# Patient Record
Sex: Female | Born: 1990 | Race: Black or African American | Hispanic: No | Marital: Single | Smoking: Former smoker
Health system: Southern US, Community
[De-identification: ages and names within clinical notes are randomized; demographics above are authoritative.]

## PROBLEM LIST (undated history)

## (undated) ENCOUNTER — Inpatient Hospital Stay (HOSPITAL_COMMUNITY): Payer: Self-pay

## (undated) DIAGNOSIS — I10 Essential (primary) hypertension: Secondary | ICD-10-CM

## (undated) DIAGNOSIS — B999 Unspecified infectious disease: Secondary | ICD-10-CM

## (undated) DIAGNOSIS — R011 Cardiac murmur, unspecified: Secondary | ICD-10-CM

## (undated) HISTORY — PX: BREAST LUMPECTOMY: SHX2

## (undated) HISTORY — DX: Essential (primary) hypertension: I10

## (undated) HISTORY — PX: INDUCED ABORTION: SHX677

---
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
## 2008-10-22 ENCOUNTER — Emergency Department (HOSPITAL_COMMUNITY): Admission: EM | Admit: 2008-10-22 | Discharge: 2008-10-22 | Payer: Self-pay | Admitting: Family Medicine

## 2009-02-01 ENCOUNTER — Emergency Department (HOSPITAL_COMMUNITY): Admission: EM | Admit: 2009-02-01 | Discharge: 2009-02-01 | Payer: Self-pay | Admitting: Family Medicine

## 2009-06-09 ENCOUNTER — Emergency Department (HOSPITAL_COMMUNITY): Admission: EM | Admit: 2009-06-09 | Discharge: 2009-06-09 | Payer: Self-pay | Admitting: Family Medicine

## 2009-10-24 ENCOUNTER — Emergency Department (HOSPITAL_COMMUNITY): Admission: EM | Admit: 2009-10-24 | Discharge: 2009-10-24 | Payer: Self-pay | Admitting: Family Medicine

## 2010-10-13 NOTE — Op Note (Signed)
NAME:  Kristin Medina, Kristin Medina              ACCOUNT NO.:  [id number]   MEDICAL RECORD NO.:  [id number]          PATIENT TYPE:  AMB   LOCATION:  DSC                          FACILITY:  [facility]   PHYSICIAN:  Leonie Man, M.D.   DATE OF BIRTH:  [birth year]   DATE OF PROCEDURE:  08/30/2004  DATE OF DISCHARGE:                                 OPERATIVE REPORT   PREOPERATIVE DIAGNOSIS:  Giant fibroadenoma left breast.   POSTOPERATIVE DIAGNOSIS:  Giant fibroadenoma left breast, pathology pending.   PROCEDURE:  Excision of left breast mass surgeon.   BALLEN:  Leonie Man, M.D.   ASSISTANT:  Nurse.   ANESTHESIA:  General.   SPECIMENS FORWARDED TO PATHOLOGY:  Mass, left breast, measuring  approximately 4 cm in greatest diameter.   ESTIMATED BLOOD LOSS:  Minimal.   COMPLICATIONS:  None.   The patient returned to the PACU in excellent condition.   NOTE:  The patient is a 20 year old female student presenting with an  enlarging left-sided breast mass located medially and superiorly in the 10  to 12 o'clock position of the left breast.  This has not been causing her  any pain.  On imaging, it appears to be a solid mass.  She comes to the  operating room now after risks and potential benefits have both been  discussed with her and her family.  They gave consent to surgery.   PROCEDURE:  Following the induction of satisfactory general anesthesia, the  patient is positioned supinely.  The left breast is prepped and draped to be  included in the sterile operative field.  A circumareolar incision carried  around from approximately the 6 o'clock to the 12 o'clock position of the  left nipple, raising the nipple as a flap and carrying the dissection  laterally, medially and superiorly.  The mass was dissected free from the  surrounding breast tissues, removed in its entirety and forwarded for  pathologic evaluation.  Hemostasis was obtained with electrocautery.  Sponge  and instrument counts  verified.  Breast tissues reapproximated with  interrupted 2-0 Vicryl  sutures, subcutaneous tissues closed with 3-0 Vicryl sutures and the skin  closed with a 5-0 Monocryl running subdermal stitch.  This was then  reinforced with Steri-Strips, sterile dressings applied, anesthetic  reversed.  The patient removed from the operating room to the recovery room  in stable condition.  She tolerated the procedure well.      PB/MEDQ  D:  08/30/2004  T:  08/30/2004  Job:  [id number]   cc:   Ermalinda Barrios, M.D.  [address] [place]  [place] [address]  Fax: [phone]

## 2011-03-08 LAB — GC/CHLAMYDIA PROBE AMP, GENITAL: Chlamydia, DNA Probe: NEGATIVE

## 2011-03-08 LAB — WET PREP, GENITAL
Clue Cells Wet Prep HPF POC: NONE SEEN
Trich, Wet Prep: NONE SEEN

## 2011-03-08 LAB — POCT PREGNANCY, URINE: Operator id: 235561

## 2011-08-21 ENCOUNTER — Encounter (HOSPITAL_COMMUNITY): Payer: Self-pay | Admitting: *Deleted

## 2011-08-21 ENCOUNTER — Emergency Department (INDEPENDENT_AMBULATORY_CARE_PROVIDER_SITE_OTHER)
Admission: EM | Admit: 2011-08-21 | Discharge: 2011-08-21 | Disposition: A | Discharge status: Home / Self Care | Payer: BC Managed Care – PPO | Source: Home / Self Care | Attending: Family Medicine | Admitting: Family Medicine

## 2011-08-21 DIAGNOSIS — J039 Acute tonsillitis, unspecified: Secondary | ICD-10-CM

## 2011-08-21 LAB — POCT RAPID STREP A: Streptococcus, Group A Screen (Direct): NEGATIVE

## 2011-08-21 MED ORDER — PENICILLIN V POTASSIUM 500 MG PO TABS: 500.0000 mg | ORAL_TABLET | Freq: Three times a day (TID) | ORAL | Status: AC

## 2011-08-21 MED ORDER — LIDOCAINE HCL (PF) 1 % IJ SOLN
INTRAMUSCULAR | Status: AC
  Filled 2011-08-21: qty 5

## 2011-08-21 MED ORDER — CEFTRIAXONE SODIUM 1 G IJ SOLR
1.0000 g | Freq: Once | INTRAMUSCULAR | Status: AC
  Administered 2011-08-21: 1 g via INTRAMUSCULAR

## 2011-08-21 MED ORDER — IBUPROFEN 600 MG PO TABS: 600.0000 mg | ORAL_TABLET | Freq: Three times a day (TID) | ORAL | Status: AC | PRN

## 2011-08-21 MED ORDER — HYDROCODONE-ACETAMINOPHEN 7.5-500 MG/15ML PO SOLN: 15.0000 mL | Freq: Three times a day (TID) | ORAL | Status: AC | PRN

## 2011-08-21 MED ORDER — CEFTRIAXONE SODIUM 1 G IJ SOLR
INTRAMUSCULAR | Status: AC
  Filled 2011-08-21: qty 10

## 2011-08-21 MED ORDER — PREDNISONE 20 MG PO TABS: ORAL_TABLET | ORAL | Status: AC

## 2011-08-21 NOTE — Discharge Instructions (Signed)
Is very important top keep well hydrated. Take the prescribed medications as instructed. Take ibuprofen scheduled every 8 hours for the next 24-48 hours take with food and plenty of liquids as it can upset your stomach. Be aware that hydrocodone can make you drowsy and she should not drive after taking it. Use nasal saline spray at least 3 times a day. (simply saline is over the counter) Return if difficulty breathing or not keeping fluids down.

## 2011-08-21 NOTE — ED Notes (Signed)
Onset 2 days ago sore throat and red, raised rash on arms and neck.  Denies fever

## 2011-08-21 NOTE — ED Provider Notes (Signed)
History     CSN: [id number]  Arrival date & time 08/21/11  1616   First MD Initiated Contact with Patient 08/21/11 1617      Chief Complaint  Patient presents with  . Sore Throat    (Consider location/radiation/quality/duration/timing/severity/associated sxs/prior treatment) HPI Comments: 21 y/o female with no significant PMH here c/o sore throat and pain with swallowing for 2 days. able to drink fluids well and solids with discomfort. Taking ibuprofen last dose about 3 hours ago. Has felt headache and chills but denies fever. Also started with rash on arms and neck 2 days ago, pruriginous. No abdominal pain, no fatigue, no nausea or vomiting, no cough or congestion. No chest pain or difficulty breathing.    History reviewed. No pertinent past medical history.  Past Surgical History  Procedure Date  . Breast lumpectomy     Family History  Problem Relation Age of Onset  . Diabetes Father     History  Substance Use Topics  . Smoking status: Never Smoker   . Smokeless tobacco: Not on file  . Alcohol Use: No    OB History    Grav Para Term Preterm Abortions TAB SAB Ect Mult Living                  Review of Systems  Constitutional: Positive for chills and appetite change. Negative for fever, diaphoresis and fatigue.  HENT: Positive for sore throat and trouble swallowing. Negative for ear pain, congestion, rhinorrhea, drooling, neck pain, neck stiffness and voice change.   Respiratory: Negative for cough and shortness of breath.   Gastrointestinal: Negative for nausea, vomiting and abdominal pain.  Musculoskeletal: Negative for arthralgias.  Skin: Positive for rash.  Neurological: Positive for headaches.    Allergies  Review of patient's allergies indicates no known allergies.  Home Medications   Current Outpatient Rx  Name Route Sig Dispense Refill  . HYDROCODONE-ACETAMINOPHEN 7.5-500 MG/15ML PO SOLN Oral Take 15 mLs by mouth every 8 (eight) hours as needed for  pain. 120 mL 0  . IBUPROFEN 600 MG PO TABS Oral Take 1 tablet (600 mg total) by mouth every 8 (eight) hours as needed for pain or fever. 20 tablet 0  . PENICILLIN V POTASSIUM 500 MG PO TABS Oral Take 1 tablet (500 mg total) by mouth 3 (three) times daily. 30 tablet 0  . PREDNISONE 20 MG PO TABS  2 tabs po daily for 3 days 6 tablet no    BP 130/75  Pulse 74  Temp(Src) 97.1 F (36.2 C) (Oral)  Resp 14  SpO2 100%  LMP 08/07/2011  Physical Exam  Nursing note and vitals reviewed. Constitutional: She is oriented to person, place, and time. She appears well-developed and well-nourished. No distress.  HENT:  Head: Normocephalic and atraumatic.       Nose normal. Significant pharyngeal and tonsillar erythema. Both tonsils moderately enlarged and exudative. No uvula deviation. no peritonsillar edema or fluctuations. symetrical elevation of soft palate. No trismus. No drooling. Tongue normal. TM's normal.  Eyes: Conjunctivae and EOM are normal. Pupils are equal, round, and reactive to light. Right eye exhibits no discharge. Left eye exhibits no discharge. No scleral icterus.  Neck: Neck supple. No thyromegaly present.  Cardiovascular: Normal rate, regular rhythm and normal heart sounds.   No murmur heard. Pulmonary/Chest: Effort normal and breath sounds normal. No respiratory distress. She has no wheezes. She has no rales. She exhibits no tenderness.  Abdominal: Soft. There is no tenderness.  No HSM  Lymphadenopathy:    She has cervical adenopathy.  Neurological: She is alert and oriented to person, place, and time.  Skin: Rash noted.       Fine sand paper like rash in arms and upper torso. Pruriginous as per patient report. No involvement of palms or soles no mucosal involvement either.     ED Course  Procedures (including critical care time)   Labs Reviewed  POCT RAPID STREP A ([facility] CARE ONLY)  POCT INFECTIOUS MONO SCREEN  STREP A DNA PROBE  LAB REPORT - SCANNED   No  results found.   1. Tonsillitis with exudate       MDM  Exudative tonsils, rapid strep and mono screen negative. Administered rocephin 1g IM here. Prescribed penicillin for 10 days, prednisone for 3 days to decrease swelling,  Hydrocodone and ibuprofen for pain or fever. DNA strep pending. Asked to return in 24-48 hours or earlier if worsening symptoms despite following treatment.         Sharin Grave, MD 08/23/11 1310

## 2011-11-19 ENCOUNTER — Emergency Department (INDEPENDENT_AMBULATORY_CARE_PROVIDER_SITE_OTHER): Payer: BC Managed Care – PPO

## 2011-11-19 ENCOUNTER — Encounter (HOSPITAL_COMMUNITY): Payer: Self-pay | Admitting: *Deleted

## 2011-11-19 ENCOUNTER — Emergency Department (HOSPITAL_COMMUNITY)
Admission: EM | Admit: 2011-11-19 | Discharge: 2011-11-19 | Disposition: A | Discharge status: Home / Self Care | Payer: BC Managed Care – PPO | Source: Home / Self Care

## 2011-11-19 DIAGNOSIS — S93409A Sprain of unspecified ligament of unspecified ankle, initial encounter: Secondary | ICD-10-CM

## 2011-11-19 MED ORDER — TRAMADOL HCL 50 MG PO TABS: 50.0000 mg | ORAL_TABLET | Freq: Four times a day (QID) | ORAL | Status: AC | PRN

## 2011-11-19 NOTE — ED Provider Notes (Signed)
History     CSN: [id number]  Arrival date & time 11/19/11  1202   First MD Initiated Contact with Patient 11/19/11 1203      No chief complaint on file.   (Consider location/radiation/quality/duration/timing/severity/associated sxs/prior treatment) HPI Patient is a 21 year old female who presents with main concern of left ankle pain and swelling that she noted this morning. She reports twisting her ankle 2 days ago as she was walking and stepped on the foot uncomfortably. She denies similar episodes in the past. She describes pain as dull, intermittent in nature, 7/10 in severity when present, nonradiating, no associated symptoms, no specific aggravating or alleviating symptoms. She is able to ambulate but has difficulty bearing weight on the left foot. Patient denies fevers and chills, no swelling anywhere else in the body, no focal neurologic weakness.  No past medical history on file.  Past Surgical History  Procedure Date  . Breast lumpectomy     Family History  Problem Relation Age of Onset  . Diabetes Father     History  Substance Use Topics  . Smoking status: Never Smoker   . Smokeless tobacco: Not on file  . Alcohol Use: No    OB History    Grav Para Term Preterm Abortions TAB SAB Ect Mult Living                  Review of Systems  Review of Systems  Constitutional: Negative for fever, chills, diaphoresis, activity change, appetite change and fatigue.  HENT: Negative for ear pain, nosebleeds, congestion, facial swelling, rhinorrhea, neck pain, neck stiffness and ear discharge.   Eyes: Negative for pain, discharge, redness, itching and visual disturbance.  Respiratory: Negative for cough, choking, chest tightness, shortness of breath, wheezing and stridor.   Cardiovascular: Negative for chest pain, palpitations and leg swelling.  Gastrointestinal: Negative for abdominal distention.  Genitourinary: Negative for dysuria, urgency, frequency, hematuria, flank  pain, decreased urine volume, difficulty urinating and dyspareunia.  Musculoskeletal: Negative for back pain, arthralgias. Neurological: Negative for dizziness, tremors, seizures, syncope, facial asymmetry, speech difficulty, weakness, light-headedness, numbness and headaches.  Hematological: Negative for adenopathy. Does not bruise/bleed easily.  Psychiatric/Behavioral: Negative for hallucinations, behavioral problems, confusion, dysphoric mood, decreased concentration and agitation.    Allergies  Review of patient's allergies indicates no known allergies.  Home Medications  No current outpatient prescriptions on file.  BP 119/80  Pulse 64  Temp 98.3 F (36.8 C) (Oral)  Resp 14  SpO2 99%  Physical Exam  Physical Exam  Constitutional: She appears well-developed and well-nourished. No distress.  Musculoskeletal: Normal range of motion. She exhibits no edema and no tenderness, except left ankle area.  left ankle area has swelling, and tenderness to palpation  Neurological: She is alert. She has normal reflexes. She displays normal reflexes. No cranial nerve deficit. She exhibits normal muscle tone. Coordination normal.  Skin: Skin is warm and dry. No rash noted. She is not diaphoretic. No erythema. No pallor.    ED Course  Procedures (including critical care time)  Labs Reviewed - No data to display  X-ray, complete of the left ankle - No acute fractures or subluxation  Ankle sprain - Please note that x-ray did not show any acute findings, fractures or subluxation - I have advised nonweightbearing for 1 - 2 days until swelling and pain improves - I have prescribed tramadol for symptomatic pain management as needed - Patient was advised if symptoms get worse she needs to see primary care physician -  Will provide ankle brace for stabilization     MDM  Ankle sprain        Dorothea Ogle, MD 11/19/11 1351

## 2011-11-19 NOTE — Discharge Instructions (Signed)
Ankle Sprain An ankle sprain is an injury to the strong, fibrous tissues (ligaments) that hold the bones of your ankle joint together.  CAUSES Ankle sprain usually is caused by a fall or by twisting your ankle. People who participate in sports are more prone to these types of injuries.  SYMPTOMS  Symptoms of ankle sprain include:  Pain in your ankle. The pain may be present at rest or only when you are trying to stand or walk.   Swelling.   Bruising. Bruising may develop immediately or within 1 to 2 days after your injury.   Difficulty standing or walking.  DIAGNOSIS  Your caregiver will ask you details about your injury and perform a physical exam of your ankle to determine if you have an ankle sprain. During the physical exam, your caregiver will press and squeeze specific areas of your foot and ankle. Your caregiver will try to move your ankle in certain ways. An X-ray exam may be done to be sure a bone was not broken or a ligament did not separate from one of the bones in your ankle (avulsion).  TREATMENT  Certain types of braces can help stabilize your ankle. Your caregiver can make a recommendation for this. Your caregiver may recommend the use of medication for pain. If your sprain is severe, your caregiver may refer you to a surgeon who helps to restore function to parts of your skeletal system (orthopedist) or a physical therapist. HOME CARE INSTRUCTIONS  Apply ice to your injury for 1 to 2 days or as directed by your caregiver. Applying ice helps to reduce inflammation and pain.  Put ice in a plastic bag.   Place a towel between your skin and the bag.   Leave the ice on for 15 to 20 minutes at a time, every 2 hours while you are awake.   Take over-the-counter or prescription medicines for pain, discomfort, or fever only as directed by your caregiver.   Keep your injured leg elevated, when possible, to lessen swelling.   If your caregiver recommends crutches, use them as  instructed. Gradually, put weight on the affected ankle. Continue to use crutches or a cane until you can walk without feeling pain in your ankle.   If you have a plaster splint, wear the splint as directed by your caregiver. Do not rest it on anything harder than a pillow the first 24 hours. Do not put weight on it. Do not get it wet. You may take it off to take a shower or bath.   You may have been given an elastic bandage to wear around your ankle to provide support. If the elastic bandage is too tight (you have numbness or tingling in your foot or your foot becomes cold and blue), adjust the bandage to make it comfortable.   If you have an air splint, you may blow more air into it or let air out to make it more comfortable. You may take your splint off at night and before taking a shower or bath.   Wiggle your toes in the splint several times per day if you are able.  SEEK MEDICAL CARE IF:   You have an increase in bruising, swelling, or pain.   Your toes feel cold.   Pain relief is not achieved with medication.  SEEK IMMEDIATE MEDICAL CARE IF: Your toes are numb or blue or you have severe pain. MAKE SURE YOU:   Understand these instructions.   Will watch your condition.     Will get help right away if you are not doing well or get worse.  Document Released: 05/14/2005 Document Revised: 05/03/2011 Document Reviewed: 12/17/2007 [employer] Patient Information 2012 [employer]. 

## 2011-11-19 NOTE — ED Notes (Signed)
Pt  Reports     Injured    L  Ankle     Playing  Basketball        5  Days  Ago    Pain on  Weight   Bearing         denys  Any  Other  Injury   No  Obvious  Deformity  Noted

## 2012-05-28 NOTE — L&D Delivery Note (Signed)
Patient was C/C/+4 and pushed for 5 minutes with epidural.   NSVD  female infant, Apgars 0,0, weight p.   The patient had no lacerations. Fundus was firm. EBL was expected. Placenta was delivered immediately intact. Vagina was clear.  Baby examined and other than strong odor of infection appeared normal and without external anomalies.   Baby did not have appearance of having been deceased for long and skin was intact.  Placenta was sent for culture for aerobe and anaerobe. Placenta and cord send for chromosomes- request made for FISH if unable to culture cells TORCH titers sent with Rhogam work up.  D/w pt above findings and tests done.  D/w pt will give number to service that will help with funeral arrangements.  Ritisha Deitrick A

## 2012-08-21 ENCOUNTER — Emergency Department (HOSPITAL_COMMUNITY)
Admission: EM | Admit: 2012-08-21 | Discharge: 2012-08-21 | Disposition: A | Discharge status: Home / Self Care | Payer: Federal, State, Local not specified - PPO | Attending: Emergency Medicine | Admitting: Emergency Medicine

## 2012-08-21 DIAGNOSIS — R1084 Generalized abdominal pain: Secondary | ICD-10-CM | POA: Insufficient documentation

## 2012-08-21 DIAGNOSIS — R197 Diarrhea, unspecified: Secondary | ICD-10-CM | POA: Insufficient documentation

## 2012-08-21 DIAGNOSIS — Z3202 Encounter for pregnancy test, result negative: Secondary | ICD-10-CM | POA: Insufficient documentation

## 2012-08-21 DIAGNOSIS — R112 Nausea with vomiting, unspecified: Secondary | ICD-10-CM

## 2012-08-21 LAB — COMPREHENSIVE METABOLIC PANEL
Albumin: 4.5 g/dL (ref 3.5–5.2)
BUN: 15 mg/dL (ref 6–23)
CO2: 25 mEq/L (ref 19–32)
Calcium: 9.6 mg/dL (ref 8.4–10.5)
Creatinine, Ser: 0.63 mg/dL (ref 0.50–1.10)
Glucose, Bld: 99 mg/dL (ref 70–99)
Potassium: 3.4 mEq/L — ABNORMAL LOW (ref 3.5–5.1)
Sodium: 141 mEq/L (ref 135–145)
Total Protein: 9.1 g/dL — ABNORMAL HIGH (ref 6.0–8.3)

## 2012-08-21 LAB — URINE MICROSCOPIC-ADD ON

## 2012-08-21 LAB — CBC WITH DIFFERENTIAL/PLATELET
Basophils Absolute: 0 10*3/uL (ref 0.0–0.1)
Basophils Relative: 0 % (ref 0–1)
HCT: 39.7 % (ref 36.0–46.0)
Lymphs Abs: 0.6 10*3/uL — ABNORMAL LOW (ref 0.7–4.0)
MCV: 82.4 fL (ref 78.0–100.0)
Monocytes Relative: 3 % (ref 3–12)
Neutro Abs: 11.7 10*3/uL — ABNORMAL HIGH (ref 1.7–7.7)
Neutrophils Relative %: 93 % — ABNORMAL HIGH (ref 43–77)
Platelets: 348 10*3/uL (ref 150–400)
RDW: 15.2 % (ref 11.5–15.5)
WBC: 12.7 10*3/uL — ABNORMAL HIGH (ref 4.0–10.5)

## 2012-08-21 LAB — URINALYSIS, ROUTINE W REFLEX MICROSCOPIC
Glucose, UA: NEGATIVE mg/dL
Nitrite: NEGATIVE
Protein, ur: 100 mg/dL — AB
Urobilinogen, UA: 0.2 mg/dL (ref 0.0–1.0)

## 2012-08-21 LAB — POCT PREGNANCY, URINE: Preg Test, Ur: NEGATIVE

## 2012-08-21 MED ORDER — ONDANSETRON HCL 4 MG/2ML IJ SOLN
4.0000 mg | Freq: Once | INTRAMUSCULAR | Status: AC
  Administered 2012-08-21: 4 mg via INTRAVENOUS
  Filled 2012-08-21: qty 2

## 2012-08-21 MED ORDER — SODIUM CHLORIDE 0.9 % IV BOLUS (SEPSIS)
1000.0000 mL | Freq: Once | INTRAVENOUS | Status: AC
  Administered 2012-08-21: 1000 mL via INTRAVENOUS

## 2012-08-21 MED ORDER — ONDANSETRON 4 MG PO TBDP
8.0000 mg | ORAL_TABLET | Freq: Once | ORAL | Status: AC
  Administered 2012-08-21: 8 mg via ORAL
  Filled 2012-08-21: qty 2

## 2012-08-21 MED ORDER — ONDANSETRON HCL 4 MG PO TABS: 4.0000 mg | ORAL_TABLET | Freq: Four times a day (QID) | ORAL | Status: DC

## 2012-08-21 NOTE — ED Notes (Addendum)
Reports Nausea, vomiting and diarrhea that began this am associated with generalized abdominal pain. Reports vomiting 7 times and diarrrhea x4.  Abdominal pain is described as cramping. Denies blood in emesis

## 2012-08-21 NOTE — ED Notes (Signed)
Pt denies any questions upon discharge. 

## 2012-08-21 NOTE — ED Provider Notes (Signed)
History     CSN: [id number]  Arrival date & time 08/21/12  1308   First MD Initiated Contact with Patient 08/21/12 2124      Chief Complaint  Patient presents with  . Emesis    (Consider location/radiation/quality/duration/timing/severity/associated sxs/prior treatment) HPI Pt presenting with nausea/vomiting and diarrhea.  Pt also states she has diffuse abdominal cramping.  Symtoms started this morning acutely.  Emesis is nonbloody and nonbilious.  Diarrhea is watery, no blood.  Denies dysuria, vaginal discharge.  No fever.  Has not been able to keep down liqiuids today.  No recent travel.  Has been around someone with similar symptoms.  There are no other associated systemic symptoms, there are no other alleviating or modifying factors.   No past medical history on file.  Past Surgical History  Procedure Laterality Date  . Breast lumpectomy      Family History  Problem Relation Age of Onset  . Diabetes Father     History  Substance Use Topics  . Smoking status: Never Smoker   . Smokeless tobacco: Not on file  . Alcohol Use: No    OB History   Grav Para Term Preterm Abortions TAB SAB Ect Mult Living                  Review of Systems ROS reviewed and all otherwise negative except for mentioned in HPI  Allergies  Review of patient's allergies indicates no known allergies.  Home Medications   Current Outpatient Rx  Name  Route  Sig  Dispense  Refill  . ondansetron (ZOFRAN) 4 MG tablet   Oral   Take 1 tablet (4 mg total) by mouth every 6 (six) hours.   12 tablet   0     BP 118/77  Pulse 78  Temp(Src) 98.5 F (36.9 C) (Oral)  Resp 16  SpO2 98% Vitals reviewed Physical Exam Physical Examination: General appearance - alert, well appearing, and in no distress Mental status - alert, oriented to person, place, and time Eyes - no conjunctival injection, no scleral icterus Mouth - mucous membranes moist, pharynx normal without lesions Chest - clear to  auscultation, no wheezes, rales or rhonchi, symmetric air entry Heart - normal rate, regular rhythm, normal S1, S2, no murmurs, rubs, clicks or gallops Abdomen - soft, nontender, nondistended, no masses or organomegaly Extremities - peripheral pulses normal, no pedal edema, no clubbing or cyanosis Skin - normal coloration and turgor, no rashes  ED Course  Procedures (including critical care time)  11:28 PM on recheck pt is tolerating po trial, feels improved.    Labs Reviewed  CBC WITH DIFFERENTIAL - Abnormal; Notable for the following:    WBC 12.7 (*)    Neutrophils Relative 93 (*)    Neutro Abs 11.7 (*)    Lymphocytes Relative 5 (*)    Lymphs Abs 0.6 (*)    All other components within normal limits  COMPREHENSIVE METABOLIC PANEL - Abnormal; Notable for the following:    Potassium 3.4 (*)    Total Protein 9.1 (*)    All other components within normal limits  URINALYSIS, ROUTINE W REFLEX MICROSCOPIC - Abnormal; Notable for the following:    Color, Urine AMBER (*)    APPearance TURBID (*)    Specific Gravity, Urine 1.039 (*)    Hgb urine dipstick LARGE (*)    Bilirubin Urine SMALL (*)    Ketones, ur >80 (*)    Protein, ur 100 (*)    All other  components within normal limits  URINE MICROSCOPIC-ADD ON - Abnormal; Notable for the following:    Squamous Epithelial / LPF FEW (*)    Bacteria, UA FEW (*)    All other components within normal limits  POCT PREGNANCY, URINE   No results found.   1. Nausea vomiting and diarrhea       MDM  Pt presenting with c/o nausea/vomitign and diarrhea.  Abdominal exam is benign.  Pt overall appears nontoxic and well hydrated.  She did have vomiting after ODT zofran, there IV placed and given IV fluids and and IV zofran.  Pt feels much improved after this.  Has tolerated po trial in ED without difficulty.  Discharged with strict return precautions.  Pt agreeable with plan.        Ethelda Chick, MD 08/23/12 [phone]

## 2013-01-21 LAB — OB RESULTS CONSOLE HEPATITIS B SURFACE ANTIGEN: Hepatitis B Surface Ag: NEGATIVE

## 2013-01-21 LAB — OB RESULTS CONSOLE ABO/RH: RH Type: NEGATIVE

## 2013-01-21 LAB — OB RESULTS CONSOLE HIV ANTIBODY (ROUTINE TESTING): HIV: NONREACTIVE

## 2013-01-21 LAB — OB RESULTS CONSOLE RUBELLA ANTIBODY, IGM: Rubella: IMMUNE

## 2013-05-17 ENCOUNTER — Inpatient Hospital Stay (HOSPITAL_COMMUNITY): Payer: Federal, State, Local not specified - PPO | Admitting: Anesthesiology

## 2013-05-17 ENCOUNTER — Inpatient Hospital Stay (HOSPITAL_COMMUNITY): Payer: Federal, State, Local not specified - PPO

## 2013-05-17 ENCOUNTER — Encounter (HOSPITAL_COMMUNITY): Payer: Federal, State, Local not specified - PPO | Admitting: Anesthesiology

## 2013-05-17 ENCOUNTER — Inpatient Hospital Stay (HOSPITAL_COMMUNITY)
Admission: AD | Admit: 2013-05-17 | Discharge: 2013-05-18 | DRG: 775 | Disposition: A | Discharge status: Home / Self Care | Payer: Federal, State, Local not specified - PPO | Source: Ambulatory Visit | Attending: Obstetrics and Gynecology | Admitting: Obstetrics and Gynecology

## 2013-05-17 ENCOUNTER — Encounter (HOSPITAL_COMMUNITY): Payer: Self-pay | Admitting: *Deleted

## 2013-05-17 DIAGNOSIS — O411090 Infection of amniotic sac and membranes, unspecified, unspecified trimester, not applicable or unspecified: Secondary | ICD-10-CM | POA: Diagnosis present

## 2013-05-17 DIAGNOSIS — Z8759 Personal history of other complications of pregnancy, childbirth and the puerperium: Secondary | ICD-10-CM

## 2013-05-17 DIAGNOSIS — O364XX0 Maternal care for intrauterine death, not applicable or unspecified: Principal | ICD-10-CM | POA: Diagnosis present

## 2013-05-17 DIAGNOSIS — O360990 Maternal care for other rhesus isoimmunization, unspecified trimester, not applicable or unspecified: Secondary | ICD-10-CM | POA: Diagnosis present

## 2013-05-17 DIAGNOSIS — IMO0002 Reserved for concepts with insufficient information to code with codable children: Secondary | ICD-10-CM

## 2013-05-17 DIAGNOSIS — Z3490 Encounter for supervision of normal pregnancy, unspecified, unspecified trimester: Secondary | ICD-10-CM

## 2013-05-17 HISTORY — DX: Personal history of other complications of pregnancy, childbirth and the puerperium: Z87.59

## 2013-05-17 LAB — CBC
HCT: 29.6 % — ABNORMAL LOW (ref 36.0–46.0)
Hemoglobin: 9.8 g/dL — ABNORMAL LOW (ref 12.0–15.0)
MCH: 27.6 pg (ref 26.0–34.0)
MCV: 83.4 fL (ref 78.0–100.0)
RDW: 14.4 % (ref 11.5–15.5)
WBC: 15.4 10*3/uL — ABNORMAL HIGH (ref 4.0–10.5)

## 2013-05-17 LAB — URINALYSIS, ROUTINE W REFLEX MICROSCOPIC
Ketones, ur: NEGATIVE mg/dL
Nitrite: NEGATIVE
Protein, ur: 30 mg/dL — AB
Specific Gravity, Urine: >1.03 — ABNORMAL HIGH (ref 1.005–1.030)
Urobilinogen, UA: 0.2 mg/dL (ref 0.0–1.0)

## 2013-05-17 LAB — URINE MICROSCOPIC-ADD ON

## 2013-05-17 LAB — TYPE AND SCREEN
ABO/RH(D): A NEG
Antibody Screen: NEGATIVE

## 2013-05-17 LAB — ABO/RH: ABO/RH(D): A NEG

## 2013-05-17 MED ORDER — SIMETHICONE 80 MG PO CHEW: 80.0000 mg | CHEWABLE_TABLET | ORAL | Status: DC | PRN

## 2013-05-17 MED ORDER — SENNOSIDES-DOCUSATE SODIUM 8.6-50 MG PO TABS: 2.0000 | ORAL_TABLET | ORAL | Status: DC

## 2013-05-17 MED ORDER — ONDANSETRON HCL 4 MG/2ML IJ SOLN: 4.0000 mg | Freq: Four times a day (QID) | INTRAMUSCULAR | Status: DC | PRN

## 2013-05-17 MED ORDER — WITCH HAZEL-GLYCERIN EX PADS: 1.0000 "application " | MEDICATED_PAD | CUTANEOUS | Status: DC | PRN

## 2013-05-17 MED ORDER — METHYLERGONOVINE MALEATE 0.2 MG/ML IJ SOLN: 0.2000 mg | INTRAMUSCULAR | Status: DC | PRN

## 2013-05-17 MED ORDER — BENZOCAINE-MENTHOL 20-0.5 % EX AERO
1.0000 "application " | INHALATION_SPRAY | CUTANEOUS | Status: DC | PRN
  Filled 2013-05-17: qty 56

## 2013-05-17 MED ORDER — FENTANYL 2.5 MCG/ML BUPIVACAINE 1/10 % EPIDURAL INFUSION (WH - ANES)
INTRAMUSCULAR | Status: DC | PRN
  Administered 2013-05-17: 14 mL/h via EPIDURAL

## 2013-05-17 MED ORDER — IBUPROFEN 800 MG PO TABS
800.0000 mg | ORAL_TABLET | Freq: Three times a day (TID) | ORAL | Status: DC
  Administered 2013-05-17 – 2013-05-18 (×3): 800 mg via ORAL
  Filled 2013-05-17 (×3): qty 1

## 2013-05-17 MED ORDER — PHENYLEPHRINE 40 MCG/ML (10ML) SYRINGE FOR IV PUSH (FOR BLOOD PRESSURE SUPPORT)
80.0000 ug | PREFILLED_SYRINGE | INTRAVENOUS | Status: DC | PRN
  Filled 2013-05-17: qty 2

## 2013-05-17 MED ORDER — TETANUS-DIPHTH-ACELL PERTUSSIS 5-2.5-18.5 LF-MCG/0.5 IM SUSP: 0.5000 mL | Freq: Once | INTRAMUSCULAR | Status: DC

## 2013-05-17 MED ORDER — OXYCODONE-ACETAMINOPHEN 5-325 MG PO TABS: 1.0000 | ORAL_TABLET | ORAL | Status: DC | PRN

## 2013-05-17 MED ORDER — EPHEDRINE 5 MG/ML INJ
10.0000 mg | INTRAVENOUS | Status: DC | PRN
  Filled 2013-05-17: qty 2

## 2013-05-17 MED ORDER — PHENYLEPHRINE 40 MCG/ML (10ML) SYRINGE FOR IV PUSH (FOR BLOOD PRESSURE SUPPORT)
80.0000 ug | PREFILLED_SYRINGE | INTRAVENOUS | Status: DC | PRN
  Filled 2013-05-17: qty 10
  Filled 2013-05-17: qty 2

## 2013-05-17 MED ORDER — LACTATED RINGERS IV SOLN: 500.0000 mL | INTRAVENOUS | Status: DC | PRN

## 2013-05-17 MED ORDER — SODIUM CHLORIDE 0.9 % IJ SOLN: 3.0000 mL | Freq: Two times a day (BID) | INTRAMUSCULAR | Status: DC

## 2013-05-17 MED ORDER — OXYTOCIN BOLUS FROM INFUSION: 500.0000 mL | INTRAVENOUS | Status: DC

## 2013-05-17 MED ORDER — MAGNESIUM HYDROXIDE 400 MG/5ML PO SUSP: 30.0000 mL | ORAL | Status: DC | PRN

## 2013-05-17 MED ORDER — DIBUCAINE 1 % RE OINT: 1.0000 "application " | TOPICAL_OINTMENT | RECTAL | Status: DC | PRN

## 2013-05-17 MED ORDER — SODIUM CHLORIDE 0.9 % IV SOLN: 250.0000 mL | INTRAVENOUS | Status: DC | PRN

## 2013-05-17 MED ORDER — LACTATED RINGERS IV SOLN
500.0000 mL | Freq: Once | INTRAVENOUS | Status: AC
  Administered 2013-05-17: 500 mL via INTRAVENOUS

## 2013-05-17 MED ORDER — LIDOCAINE HCL (PF) 1 % IJ SOLN
INTRAMUSCULAR | Status: DC | PRN
  Administered 2013-05-17 (×2): 4 mL

## 2013-05-17 MED ORDER — ACETAMINOPHEN 325 MG PO TABS
650.0000 mg | ORAL_TABLET | ORAL | Status: DC | PRN
  Administered 2013-05-17: 650 mg via ORAL
  Filled 2013-05-17: qty 2

## 2013-05-17 MED ORDER — LANOLIN HYDROUS EX OINT: TOPICAL_OINTMENT | CUTANEOUS | Status: DC | PRN

## 2013-05-17 MED ORDER — ONDANSETRON HCL 4 MG/2ML IJ SOLN: 4.0000 mg | INTRAMUSCULAR | Status: DC | PRN

## 2013-05-17 MED ORDER — OXYTOCIN 40 UNITS IN LACTATED RINGERS INFUSION - SIMPLE MED
1.0000 m[IU]/min | INTRAVENOUS | Status: DC
  Administered 2013-05-17: 2 m[IU]/min via INTRAVENOUS
  Filled 2013-05-17: qty 1000

## 2013-05-17 MED ORDER — FENTANYL 2.5 MCG/ML BUPIVACAINE 1/10 % EPIDURAL INFUSION (WH - ANES)
14.0000 mL/h | INTRAMUSCULAR | Status: DC | PRN
Start: 2013-05-17 — End: 2013-05-17
  Filled 2013-05-17: qty 125

## 2013-05-17 MED ORDER — MEASLES, MUMPS & RUBELLA VAC ~~LOC~~ INJ: 0.5000 mL | INJECTION | Freq: Once | SUBCUTANEOUS | Status: DC

## 2013-05-17 MED ORDER — METHYLERGONOVINE MALEATE 0.2 MG PO TABS: 0.2000 mg | ORAL_TABLET | ORAL | Status: DC | PRN

## 2013-05-17 MED ORDER — CITRIC ACID-SODIUM CITRATE 334-500 MG/5ML PO SOLN: 30.0000 mL | ORAL | Status: DC | PRN

## 2013-05-17 MED ORDER — BUTORPHANOL TARTRATE 1 MG/ML IJ SOLN
1.0000 mg | INTRAMUSCULAR | Status: DC | PRN
  Administered 2013-05-17: 1 mg via INTRAVENOUS
  Filled 2013-05-17: qty 1

## 2013-05-17 MED ORDER — LACTATED RINGERS IV SOLN
INTRAVENOUS | Status: DC
  Administered 2013-05-17 (×2): via INTRAVENOUS

## 2013-05-17 MED ORDER — EPHEDRINE 5 MG/ML INJ
10.0000 mg | INTRAVENOUS | Status: DC | PRN
  Filled 2013-05-17: qty 2
  Filled 2013-05-17: qty 4

## 2013-05-17 MED ORDER — OXYTOCIN 40 UNITS IN LACTATED RINGERS INFUSION - SIMPLE MED: 62.5000 mL/h | INTRAVENOUS | Status: DC

## 2013-05-17 MED ORDER — DIPHENHYDRAMINE HCL 25 MG PO CAPS: 25.0000 mg | ORAL_CAPSULE | Freq: Four times a day (QID) | ORAL | Status: DC | PRN

## 2013-05-17 MED ORDER — ZOLPIDEM TARTRATE 5 MG PO TABS
5.0000 mg | ORAL_TABLET | Freq: Every evening | ORAL | Status: DC | PRN
  Administered 2013-05-17: 5 mg via ORAL
  Filled 2013-05-17: qty 1

## 2013-05-17 MED ORDER — ONDANSETRON HCL 4 MG PO TABS: 4.0000 mg | ORAL_TABLET | ORAL | Status: DC | PRN

## 2013-05-17 MED ORDER — DIPHENHYDRAMINE HCL 50 MG/ML IJ SOLN: 12.5000 mg | INTRAMUSCULAR | Status: DC | PRN

## 2013-05-17 MED ORDER — TERBUTALINE SULFATE 1 MG/ML IJ SOLN: 0.2500 mg | Freq: Once | INTRAMUSCULAR | Status: DC | PRN

## 2013-05-17 MED ORDER — IBUPROFEN 600 MG PO TABS: 600.0000 mg | ORAL_TABLET | Freq: Four times a day (QID) | ORAL | Status: DC | PRN

## 2013-05-17 MED ORDER — SODIUM CHLORIDE 0.9 % IJ SOLN: 3.0000 mL | INTRAMUSCULAR | Status: DC | PRN

## 2013-05-17 MED ORDER — LIDOCAINE HCL (PF) 1 % IJ SOLN
30.0000 mL | INTRAMUSCULAR | Status: DC | PRN
  Filled 2013-05-17 (×2): qty 30

## 2013-05-17 MED ORDER — FERROUS SULFATE 325 (65 FE) MG PO TABS
325.0000 mg | ORAL_TABLET | Freq: Two times a day (BID) | ORAL | Status: DC
  Administered 2013-05-17 – 2013-05-18 (×2): 325 mg via ORAL
  Filled 2013-05-17 (×2): qty 1

## 2013-05-17 NOTE — Anesthesia Preprocedure Evaluation (Signed)
Anesthesia Evaluation  Patient identified by MRN, date of birth, ID band Patient awake    Reviewed: Allergy & Precautions, H&P , NPO status , Patient's Chart, lab work & pertinent test results  Airway Mallampati: II TM Distance: >3 FB Neck ROM: Full    Dental  (+) Dental Advisory Given and Teeth Intact   Pulmonary neg pulmonary ROS,  breath sounds clear to auscultation        Cardiovascular negative cardio ROS  Rhythm:Regular Rate:Normal     Neuro/Psych negative neurological ROS  negative psych ROS   GI/Hepatic negative GI ROS, Neg liver ROS,   Endo/Other  negative endocrine ROS  Renal/GU negative Renal ROS     Musculoskeletal negative musculoskeletal ROS (+)   Abdominal   Peds  Hematology negative hematology ROS (+)   Anesthesia Other Findings   Reproductive/Obstetrics (+) Pregnancy IUFD                           Anesthesia Physical Anesthesia Plan  ASA: II  Anesthesia Plan: Epidural   Post-op Pain Management:    Induction:   Airway Management Planned:   Additional Equipment:   Intra-op Plan:   Post-operative Plan:   Informed Consent: I have reviewed the patients History and Physical, chart, labs and discussed the procedure including the risks, benefits and alternatives for the proposed anesthesia with the patient or authorized representative who has indicated his/her understanding and acceptance.     Plan Discussed with:   Anesthesia Plan Comments:         Anesthesia Quick Evaluation

## 2013-05-17 NOTE — Progress Notes (Signed)
To BS via w/c °

## 2013-05-17 NOTE — Progress Notes (Signed)
Patient received comfort packet and CD.  Discussed heartstrings support information and funeral home list.  Chaplain support offered to patient but not desired at this time.  Patient in room with family.  Will continue to monitor.  Osvaldo Angst, RN-------------

## 2013-05-17 NOTE — Anesthesia Postprocedure Evaluation (Signed)
  Anesthesia Post-op Note  Patient: Kristin Medina  Procedure(s) Performed: * No procedures listed *  Patient Location: [facility]  Anesthesia Type:Epidural  Level of Consciousness: awake and alert   Airway and Oxygen Therapy: Patient Spontanous Breathing  Post-op Pain: mild  Post-op Assessment: Patient's Cardiovascular Status Stable, Respiratory Function Stable, No signs of Nausea or vomiting, Pain level controlled, No headache, No residual numbness and No residual motor weakness  Post-op Vital Signs: stable  Complications: No apparent anesthesia complications

## 2013-05-17 NOTE — Anesthesia Procedure Notes (Signed)
Epidural Patient location during procedure: OB Start time: 05/17/2013 7:38 AM End time: 05/17/2013 7:50 AM  Staffing Anesthesiologist: Lewie Loron R Performed by: anesthesiologist   Preanesthetic Checklist Completed: patient identified, pre-op evaluation, timeout performed, IV checked, risks and benefits discussed and monitors and equipment checked  Epidural Patient position: sitting Prep: site prepped and draped and DuraPrep Patient monitoring: blood pressure, continuous pulse ox and heart rate Approach: midline Injection technique: LOR saline and LOR air  Needle:  Needle type: Tuohy  Needle gauge: 17 G Needle length: 9 cm Needle insertion depth: 6 cm Catheter type: closed end flexible Catheter size: 19 Gauge Catheter at skin depth: 12 cm Test dose: negative  Assessment Sensory level: T8 Events: blood not aspirated, injection not painful, no injection resistance, negative IV test and no paresthesia  Additional Notes Reason for block:procedure for pain

## 2013-05-17 NOTE — MAU Note (Signed)
Dr Reola Calkins notified of pt's admission and status. Aware of inability to hear FHTs with transducer or doppler. Will do limited OB u/s.

## 2013-05-17 NOTE — H&P (Signed)
Chief Complaint:  Abdominal Pain   Kristin Medina is a 22 y.o.  G2P0010 with IUP at [redacted]w[redacted]d presenting for Abdominal Pain   Pt states that she has been having lower abdominal cramping ever since earlier today. Says it has become more painful and happening at regular intervals every 5 min apart.  She has been feeling her baby move throughout. No vb. No LOF.  Pt is seen by Dr. Tenny Craw. Care thus far this pregnancy has been uncomplicated. No issues or concerns.        Menstrual History: OB History   Grav Para Term Preterm Abortions TAB SAB Ect Mult Living   2 0 0 0 1 1 0 0 0 0      G1- early TAB G2- current   No LMP recorded. Patient is pregnant.      Past Medical History  Diagnosis Date  . Medical history non-contributory     Past Surgical History  Procedure Laterality Date  . Breast lumpectomy      Family History  Problem Relation Age of Onset  . Diabetes Father     History  Substance Use Topics  . Smoking status: Never Smoker   . Smokeless tobacco: Not on file  . Alcohol Use: No     No Known Allergies  Prescriptions prior to admission  Medication Sig Dispense Refill  . ondansetron (ZOFRAN) 4 MG tablet Take 1 tablet (4 mg total) by mouth every 6 (six) hours.  12 tablet  0    Review of Systems - Negative except for what is mentioned in HPI.  Physical Exam  Blood pressure 114/61, pulse 97, temperature 98.1 F (36.7 C), resp. rate 18, height 5\' 4"  (1.626 m), weight 79.833 kg (176 lb). GENERAL: Well-developed, well-nourished female in no acute distress.  LUNGS: Clear to auscultation bilaterally.  HEART: Regular rate and rhythm. ABDOMEN: Soft, nontender, nondistended, gravid.  EXTREMITIES: Nontender, no edema, 2+ distal pulses. Cervical Exam: Dilatation 3cm   Effacement 60%   Station -3   Presentation: cephalic FHT:  No heart tones on doppler or fetal monitoring system.   Contractions: Every 3-6 mins   Labs: Results for orders placed during the hospital  encounter of 05/17/13 (from the past 24 hour(s))  URINALYSIS, ROUTINE W REFLEX MICROSCOPIC   Collection Time    05/17/13  4:20 AM      Result Value Range   Color, Urine YELLOW  YELLOW   APPearance CLOUDY (*) CLEAR   Specific Gravity, Urine >1.030 (*) 1.005 - 1.030   pH 6.0  5.0 - 8.0   Glucose, UA NEGATIVE  NEGATIVE mg/dL   Hgb urine dipstick LARGE (*) NEGATIVE   Bilirubin Urine NEGATIVE  NEGATIVE   Ketones, ur NEGATIVE  NEGATIVE mg/dL   Protein, ur 30 (*) NEGATIVE mg/dL   Urobilinogen, UA 0.2  0.0 - 1.0 mg/dL   Nitrite NEGATIVE  NEGATIVE   Leukocytes, UA MODERATE (*) NEGATIVE  URINE MICROSCOPIC-ADD ON   Collection Time    05/17/13  4:20 AM      Result Value Range   Squamous Epithelial / LPF FEW (*) RARE   WBC, UA TOO NUMEROUS TO COUNT  <3 WBC/hpf   RBC / HPF TOO NUMEROUS TO COUNT  <3 RBC/hpf   Bacteria, UA FEW (*) RARE    Imaging Studies:  05/17/13-  US OB limited - IUFD confirmed with no heart beat on [place].    Assessment: Kristin Medina is  22 y.o. G2P0010 at Unknown presents with Abdominal  Pain Noted to have no fetal heart beat on doppler. US obtained to confirm IUFD.  Will admit for management.  Plan:  1) admit to L&D - routine admission orders - augment labor with pitocin  - epidural when requested  2) cont discussion with family as to autopsy and other services in the AM  3) anticipate SVD  Dr. Henderson Cloud notified and above discussed with her.

## 2013-05-17 NOTE — MAU Note (Addendum)
Lower abd pains and some pink mucousy d/c. Good FM. Pain started yest about 1330

## 2013-05-17 NOTE — MAU Note (Signed)
U/S in to do bedside u/s.

## 2013-05-17 NOTE — MAU Provider Note (Signed)
Chief Complaint:  Abdominal Pain   Amiera C Kamaka is a 22 y.o.  G2P0010 with IUP at [redacted]w[redacted]d presenting for Abdominal Pain   Pt states that she has been having lower abdominal cramping ever since earlier today. Says it has become more painful and happening at regular intervals every 5 min apart.  She has been feeling her baby move throughout. No vb. No LOF.      Menstrual History: OB History   Grav Para Term Preterm Abortions TAB SAB Ect Mult Living   2 0 0 0 1 1 0 0 0 0      G1- early TAB G2- current   No LMP recorded. Patient is pregnant.      Past Medical History  Diagnosis Date  . Medical history non-contributory     Past Surgical History  Procedure Laterality Date  . Breast lumpectomy      Family History  Problem Relation Age of Onset  . Diabetes Father     History  Substance Use Topics  . Smoking status: Never Smoker   . Smokeless tobacco: Not on file  . Alcohol Use: No     No Known Allergies  Prescriptions prior to admission  Medication Sig Dispense Refill  . ondansetron (ZOFRAN) 4 MG tablet Take 1 tablet (4 mg total) by mouth every 6 (six) hours.  12 tablet  0    Review of Systems - Negative except for what is mentioned in HPI.  Physical Exam  Blood pressure 114/61, pulse 97, temperature 98.1 F (36.7 C), resp. rate 18, height 5\' 4"  (1.626 m), weight 79.833 kg (176 lb). GENERAL: Well-developed, well-nourished female in no acute distress.  LUNGS: Clear to auscultation bilaterally.  HEART: Regular rate and rhythm. ABDOMEN: Soft, nontender, nondistended, gravid.  EXTREMITIES: Nontender, no edema, 2+ distal pulses. Cervical Exam: Dilatation 3cm   Effacement 60%   Station -3   Presentation: cephalic FHT:  No heart tones on doppler or fetal monitoring system.   Contractions: Every 3-6 mins   Labs: Results for orders placed during the hospital encounter of 05/17/13 (from the past 24 hour(s))  URINALYSIS, ROUTINE W REFLEX MICROSCOPIC   Collection  Time    05/17/13  4:20 AM      Result Value Range   Color, Urine YELLOW  YELLOW   APPearance CLOUDY (*) CLEAR   Specific Gravity, Urine >1.030 (*) 1.005 - 1.030   pH 6.0  5.0 - 8.0   Glucose, UA NEGATIVE  NEGATIVE mg/dL   Hgb urine dipstick LARGE (*) NEGATIVE   Bilirubin Urine NEGATIVE  NEGATIVE   Ketones, ur NEGATIVE  NEGATIVE mg/dL   Protein, ur 30 (*) NEGATIVE mg/dL   Urobilinogen, UA 0.2  0.0 - 1.0 mg/dL   Nitrite NEGATIVE  NEGATIVE   Leukocytes, UA MODERATE (*) NEGATIVE  URINE MICROSCOPIC-ADD ON   Collection Time    05/17/13  4:20 AM      Result Value Range   Squamous Epithelial / LPF FEW (*) RARE   WBC, UA TOO NUMEROUS TO COUNT  <3 WBC/hpf   RBC / HPF TOO NUMEROUS TO COUNT  <3 RBC/hpf   Bacteria, UA FEW (*) RARE    Imaging Studies:  05/17/13-  US OB limited - IUFD confirmed with no heart beat on [place].    Assessment: Clariza BRYANNE RIQUELME is  22 y.o. G2P0010 at Unknown presents with Abdominal Pain Noted to have no fetal heart beat on doppler. US obtained to confirm IUFD.  Will admit for management.  Plan:  1) admit to L&D - routine admission orders - augment labor with pitocin  - epidural when requested  2) cont discussion with family as to autopsy and other services in the AM  3) anticipate SVD  Dr. Henderson Cloud notified and above discussed with her.   Aryan Sparks L 12/21/20145:16 AM

## 2013-05-17 NOTE — MAU Note (Signed)
Report called to Ashley RN in BS 

## 2013-05-17 NOTE — Progress Notes (Signed)
Dr Reola Calkins in to check cervix and discuss plan of care

## 2013-05-17 NOTE — H&P (Addendum)
22 y.o. [redacted]w[redacted]d  G2P0010 comes in c/o contractions.  Although patien reported fetal movement and no bleeding, MAU was unable to find FHTS and IUFD was confirmed with [place].  Pt had no other c/o.  Past Medical History  Diagnosis Date  . Medical history non-contributory     Past Surgical History  Procedure Laterality Date  . Breast lumpectomy      OB History  Gravida Para Term Preterm AB SAB TAB Ectopic Multiple Living  2 0 0 0 1 0 1 0 0 0     # Outcome Date GA Lbr Len/2nd Weight Sex Delivery Anes PTL Lv  2 CUR           1 TAB               History   Social History  . Marital Status: Single    Spouse Name: N/A    Number of Children: N/A  . Years of Education: N/A   Occupational History  . Not on file.   Social History Main Topics  . Smoking status: Never Smoker   . Smokeless tobacco: Never Used  . Alcohol Use: No  . Drug Use: Yes    Special: Marijuana     Comment: last couple wks ago  . Sexual Activity: Yes    Birth Control/ Protection: Condom     Comment: pregnant   Other Topics Concern  . Not on file   Social History Narrative  . No narrative on file   Review of patient's allergies indicates no known allergies.    Prenatal Transfer Tool  Maternal Diabetes: No- too early for first tri Genetic Screening: Abnormal:  Results: Other:First tri screen showed risk of 1:311, higher than age risk but below cut off fro amnio.  Pt was counseled about NIPT test but did not do.  AFP was normal. Maternal Ultrasounds/Referrals: Normal-no markers seen on [place]. Fetal Ultrasounds or other Referrals:  None Maternal Substance Abuse:  Yes:  Type: Marijuana-pt indicated she had used a couple times Significant Maternal Medications:  None Significant Maternal Lab Results: Lab values include: Rh negative  Other PNC: uncomplicated.    Filed Vitals:   05/17/13 0901  BP: 101/63  Pulse: 92  Temp:   Resp: 20     Lungs/Cor:  NAD Abdomen:  soft, gravid Ex:  no cords, erythema SVE:   5-6/C/-2; AROM dark and foul odor FHTs: not present. Toco:  q3-5   A/P   IUFD at 25 weeks; vtx.   1. Pt is RH neg and will need Rhogam after delivery.  2. Pt on Pitocin and comfortable with epidural.   3. Given First tri screen slightly more risk, I counseled pt about sending fetal chromosomes.  Autopsy d/w pt. 4. Torch titers to be done with Rhogam workup after delivery. 5.  Placenta will be sent to path. 6. Expectations for progress, delivery, placental delivery and possible D&C if placenta not spontaneous d/w pt.  Clete Kuch A

## 2013-05-18 LAB — URINE CULTURE

## 2013-05-18 LAB — KLEIHAUER-BETKE STAIN: # Vials RhIg: 1

## 2013-05-18 NOTE — Discharge Summary (Signed)
Obstetric Discharge Summary Reason for Admission: onset of labor and fetal demise at 25 weeks. Prenatal Procedures: ultrasound Intrapartum Procedures: spontaneous vaginal delivery Postpartum Procedures: none Complications-Operative and Postpartum: none Hemoglobin  Date Value Range Status  05/17/2013 9.8* 12.0 - 15.0 g/dL Final     HCT  Date Value Range Status  05/17/2013 29.6* 36.0 - 46.0 % Final    Physical Exam:  General: alert Lochia: appropriate Uterine Fundus: firm   Discharge Diagnoses: Fetal demise at 25 weeks.  Discharge Information: Date: 05/18/2013 Activity: pelvic rest Diet: routine Medications: PNV Condition: stable Instructions: refer to practice specific booklet Discharge to: home Follow-up Information   Follow up with HORVATH,MICHELLE A, MD. Schedule an appointment as soon as possible for a visit in 4 weeks.   Specialty:  Obstetrics and Gynecology   Contact information:   [address] [place] [address] [phone]       Newborn Data: Live born female  Birth Weight: 2 lb 0.5 oz (921 g) APGAR: 0, 0  Home with fetal demise.Kristin Medina E 05/18/2013, 9:46 AM

## 2013-05-18 NOTE — Progress Notes (Signed)
I offered bereavement support and education to pt and her boyfriend. I also visited with pt's mother separately. They were grieving appropriately and supporting each other well.  They have a close-knit family and a strong faith.  They are aware of on-going resources for support including [facility] and coming back for 1:1 grief support from chaplains.    [address] [place] Pager, [phone] 11:38 AM   05/18/13 1100  Clinical Encounter Type  Visited With Patient and family together;Family  Visit Type Spiritual support  Referral From Nurse  Consult/Referral To ([facility])  Spiritual Encounters  Spiritual Needs Emotional;Grief support  Stress Factors  Patient Stress Factors Loss

## 2013-05-18 NOTE — Progress Notes (Signed)
PPD#1 Pt is in shower but states that she would like to go home. Pt is physically doing well. Will discharge to home.

## 2013-05-18 NOTE — Progress Notes (Signed)
Pt  Is discharged in the care of Mother. Downstairs per ambulatory. Stable. Denies any pain or discomfort. Stable. Denies heavy vaginal bleeding. Emotioal support given Pt. Allowed to verbalize fear and feeling about lost.

## 2013-05-19 ENCOUNTER — Ambulatory Visit (HOSPITAL_COMMUNITY)
Admission: AD | Admit: 2013-05-19 | Discharge: 2013-05-19 | Disposition: A | Discharge status: Home / Self Care | Payer: Federal, State, Local not specified - PPO | Source: Ambulatory Visit | Attending: Obstetrics and Gynecology | Admitting: Obstetrics and Gynecology

## 2013-05-19 DIAGNOSIS — Z2989 Encounter for other specified prophylactic measures: Secondary | ICD-10-CM | POA: Insufficient documentation

## 2013-05-19 DIAGNOSIS — Z298 Encounter for other specified prophylactic measures: Secondary | ICD-10-CM | POA: Insufficient documentation

## 2013-05-19 LAB — TOXOPLASMA GONDII ANTIBODY, IGG: Toxoplasma IgG Ratio: <3 IU/mL (ref <7.2)

## 2013-05-19 LAB — CMV ANTIBODY, IGG (EIA): CMV Ab - IgG: 4.3 U/mL — ABNORMAL HIGH (ref <0.60)

## 2013-05-19 MED ORDER — RHO D IMMUNE GLOBULIN 1500 UNIT/2ML IJ SOLN
300.0000 ug | Freq: Once | INTRAMUSCULAR | Status: AC
  Administered 2013-05-19: 300 ug via INTRAMUSCULAR

## 2013-05-19 NOTE — Progress Notes (Signed)
Patient has received patient information regarding Rhophylac and states she understands and has no questions.

## 2013-05-20 LAB — TORCH-IGM(TOXO/ RUB/ CMV/ HSV) W TITER
HSV 1 IgM Abs: NEGATIVE
RPR Screen: NONREACTIVE
Rubella IgM Index: <0.9 (ref <0.90)

## 2013-05-20 LAB — RH IG WORKUP (INCLUDES ABO/RH): ABO/RH(D): A NEG

## 2013-05-28 DEATH — deceased

## 2013-06-11 LAB — TISSUE HYBRIDIZATION TO NCBH

## 2013-06-11 LAB — CHROMOSOME STD, POC(TISSUE)-NCBH

## 2014-03-04 ENCOUNTER — Encounter (HOSPITAL_COMMUNITY): Payer: Self-pay

## 2014-03-04 ENCOUNTER — Other Ambulatory Visit (HOSPITAL_COMMUNITY): Payer: Self-pay | Admitting: Advanced Practice Midwife

## 2014-03-04 ENCOUNTER — Inpatient Hospital Stay (HOSPITAL_COMMUNITY): Payer: Federal, State, Local not specified - PPO

## 2014-03-04 ENCOUNTER — Inpatient Hospital Stay (HOSPITAL_COMMUNITY)
Admission: AD | Admit: 2014-03-04 | Discharge: 2014-03-04 | Disposition: A | Discharge status: Home / Self Care | Payer: Federal, State, Local not specified - PPO | Source: Ambulatory Visit | Attending: Obstetrics and Gynecology | Admitting: Obstetrics and Gynecology

## 2014-03-04 DIAGNOSIS — N949 Unspecified condition associated with female genital organs and menstrual cycle: Secondary | ICD-10-CM | POA: Diagnosis present

## 2014-03-04 DIAGNOSIS — O26899 Other specified pregnancy related conditions, unspecified trimester: Secondary | ICD-10-CM

## 2014-03-04 DIAGNOSIS — R109 Unspecified abdominal pain: Secondary | ICD-10-CM

## 2014-03-04 DIAGNOSIS — O9989 Other specified diseases and conditions complicating pregnancy, childbirth and the puerperium: Secondary | ICD-10-CM | POA: Diagnosis not present

## 2014-03-04 DIAGNOSIS — Z3A01 Less than 8 weeks gestation of pregnancy: Secondary | ICD-10-CM | POA: Insufficient documentation

## 2014-03-04 LAB — HIV ANTIBODY (ROUTINE TESTING W REFLEX): HIV: NONREACTIVE

## 2014-03-04 LAB — URINE MICROSCOPIC-ADD ON

## 2014-03-04 LAB — CBC
HCT: 32.3 % — ABNORMAL LOW (ref 36.0–46.0)
HEMOGLOBIN: 11.1 g/dL — AB (ref 12.0–15.0)
MCH: 28.3 pg (ref 26.0–34.0)
MCHC: 34.4 g/dL (ref 30.0–36.0)
MCV: 82.4 fL (ref 78.0–100.0)
PLATELETS: 301 10*3/uL (ref 150–400)
RBC: 3.92 MIL/uL (ref 3.87–5.11)
RDW: 14.3 % (ref 11.5–15.5)
WBC: 12.5 10*3/uL — ABNORMAL HIGH (ref 4.0–10.5)

## 2014-03-04 LAB — URINALYSIS, ROUTINE W REFLEX MICROSCOPIC
BILIRUBIN URINE: NEGATIVE
Glucose, UA: NEGATIVE mg/dL
KETONES UR: NEGATIVE mg/dL
Leukocytes, UA: NEGATIVE
NITRITE: NEGATIVE
PH: 6 (ref 5.0–8.0)
PROTEIN: NEGATIVE mg/dL
Specific Gravity, Urine: >1.03 — ABNORMAL HIGH (ref 1.005–1.030)
Urobilinogen, UA: 0.2 mg/dL (ref 0.0–1.0)

## 2014-03-04 LAB — WET PREP, GENITAL
Trich, Wet Prep: NONE SEEN
YEAST WET PREP: NONE SEEN

## 2014-03-04 LAB — HCG, QUANTITATIVE, PREGNANCY: hCG, Beta Chain, Quant, S: 8420 m[IU]/mL — ABNORMAL HIGH (ref <5)

## 2014-03-04 LAB — POCT PREGNANCY, URINE: Preg Test, Ur: POSITIVE — AB

## 2014-03-04 MED ORDER — SULFAMETHOXAZOLE-TMP DS 800-160 MG PO TABS: 1.0000 | ORAL_TABLET | Freq: Two times a day (BID) | ORAL | Status: DC

## 2014-03-04 NOTE — MAU Provider Note (Signed)
History     CSN: [id number]  Arrival date and time: 03/04/14 6295   First Provider Initiated Contact with Patient 03/04/14 0126      Chief Complaint  Patient presents with  . Possible Pregnancy  . Abdominal Pain   HPI This is a 23 y.o. female at [redacted]w[redacted]d by LMP who presents with c/o pelvic pain. Is worried this will lead to preterm labor like the 25 week demise she had in late 2014.  Very nervous about this pregnancy. Denies bleeding. Pain is sharp and shooting, intermittent.   RN Note;   Lower abdominal pain since yesterday that shoots down into vagina. Denies vaginal bleeding or discharge.        OB History   Grav Para Term Preterm Abortions TAB SAB Ect Mult Living   4 1 0 1 2 2 0 0 0 0       Past Medical History  Diagnosis Date  . Preterm labor     Past Surgical History  Procedure Laterality Date  . Breast lumpectomy      Family History  Problem Relation Age of Onset  . Diabetes Father     History  Substance Use Topics  . Smoking status: Never Smoker   . Smokeless tobacco: Never Used  . Alcohol Use: No    Allergies: No Known Allergies  No prescriptions prior to admission    Review of Systems  Constitutional: Negative for fever, chills and malaise/fatigue.  Gastrointestinal: Positive for abdominal pain. Negative for nausea, vomiting, diarrhea and constipation.  Genitourinary: Negative for dysuria.  Neurological: Negative for dizziness, weakness and headaches.   Physical Exam   Blood pressure 143/92, pulse 83, temperature 98.1 F (36.7 C), temperature source Oral, resp. rate 18, height 5\' 5"  (1.651 m), weight 166 lb 6.4 oz (75.479 kg), last menstrual period 01/24/2014.  Physical Exam  Constitutional: She is oriented to person, place, and time. She appears well-developed and well-nourished. No distress.  HENT:  Head: Normocephalic.  Cardiovascular: Normal rate.   Respiratory: Effort normal.  GI: Soft. She exhibits no distension and no mass. There  is no tenderness. There is no rebound and no guarding.  Genitourinary: Vagina normal and uterus normal. No vaginal discharge found.  Uterus small and nontender Adnexae nontender  Musculoskeletal: Normal range of motion.  Neurological: She is alert and oriented to person, place, and time.  Skin: Skin is warm and dry.  Psychiatric: She has a normal mood and affect.    MAU Course  Procedures  MDM Results for orders placed during the hospital encounter of 03/04/14 (from the past 72 hour(s))  URINALYSIS, ROUTINE W REFLEX MICROSCOPIC     Status: Abnormal   Collection Time    03/04/14 12:55 AM      Result Value Ref Range   Color, Urine YELLOW  YELLOW   APPearance CLEAR  CLEAR   Specific Gravity, Urine >1.030 (*) 1.005 - 1.030   pH 6.0  5.0 - 8.0   Glucose, UA NEGATIVE  NEGATIVE mg/dL   Hgb urine dipstick TRACE (*) NEGATIVE   Bilirubin Urine NEGATIVE  NEGATIVE   Ketones, ur NEGATIVE  NEGATIVE mg/dL   Protein, ur NEGATIVE  NEGATIVE mg/dL   Urobilinogen, UA 0.2  0.0 - 1.0 mg/dL   Nitrite NEGATIVE  NEGATIVE   Leukocytes, UA NEGATIVE  NEGATIVE  URINE MICROSCOPIC-ADD ON     Status: Abnormal   Collection Time    03/04/14 12:55 AM      Result Value Ref Range  Squamous Epithelial / LPF FEW (*) RARE   WBC, UA 0-2  <3 WBC/hpf   RBC / HPF 0-2  <3 RBC/hpf   Bacteria, UA RARE  RARE   Urine-Other MUCOUS PRESENT    POCT PREGNANCY, URINE     Status: Abnormal   Collection Time    03/04/14  1:05 AM      Result Value Ref Range   Preg Test, Ur POSITIVE (*) NEGATIVE   Comment:            THE SENSITIVITY OF THIS     METHODOLOGY IS >24 mIU/mL  CBC     Status: Abnormal   Collection Time    03/04/14  1:35 AM      Result Value Ref Range   WBC 12.5 (*) 4.0 - 10.5 K/uL   RBC 3.92  3.87 - 5.11 MIL/uL   Hemoglobin 11.1 (*) 12.0 - 15.0 g/dL   HCT 16.1 (*) 09.6 - 04.5 %   MCV 82.4  78.0 - 100.0 fL   MCH 28.3  26.0 - 34.0 pg   MCHC 34.4  30.0 - 36.0 g/dL   RDW 40.9  81.1 - 91.4 %   Platelets 301   150 - 400 K/uL  HCG, QUANTITATIVE, PREGNANCY     Status: Abnormal   Collection Time    03/04/14  1:35 AM      Result Value Ref Range   hCG, Beta Chain, Quant, S 8420 (*) <5 mIU/mL   Comment:              GEST. AGE      CONC.  (mIU/mL)       <=1 WEEK        5 - 50         2 WEEKS       50 - 500         3 WEEKS       100 - 10,000         4 WEEKS     1,000 - 30,000         5 WEEKS     3,500 - 115,000       6-8 WEEKS     12,000 - 270,000        12 WEEKS     15,000 - 220,000                FEMALE AND NON-PREGNANT FEMALE:         LESS THAN 5 mIU/mL  WET PREP, GENITAL     Status: Abnormal   Collection Time    03/04/14  2:20 AM      Result Value Ref Range   Yeast Wet Prep HPF POC NONE SEEN  NONE SEEN   Trich, Wet Prep NONE SEEN  NONE SEEN   Clue Cells Wet Prep HPF POC FEW (*) NONE SEEN   WBC, Wet Prep HPF POC RARE (*) NONE SEEN   Comment: FEW BACTERIA SEEN   US Ob Transvaginal  03/04/2014   CLINICAL DATA:  Acute onset of lower abdominal and pelvic pain for 2 days. Initial encounter.  EXAM: OBSTETRIC <14 WK [place] AND TRANSVAGINAL OB US  TECHNIQUE: Both transabdominal and transvaginal ultrasound examinations were performed for complete evaluation of the gestation as well as the maternal uterus, adnexal regions, and pelvic cul-de-sac. Transvaginal technique was performed to assess early pregnancy.  COMPARISON:  Pelvic ultrasound performed 05/17/2013    FINDINGS: Intrauterine gestational sac: Visualized/normal in shape.  Yolk sac:  Yes                       Embryo:  Yes                       Cardiac Activity: Yes                        Heart Rate: 102 bpm                        CRL:   2.3  mm   5 w 6 d                    US EDC: 10/29/2014  Maternal uterus/adnexae: No subchorionic hemorrhage is noted. The uterus is unremarkable in appearance.  The ovaries are within normal limits. The right ovary measures 2.7 x 1.7 x 1.8 cm, while the left ovary measures 2.7 x 1.9 x 1.7 cm.  No suspicious adnexal masses are seen; there is no evidence for ovarian torsion.  A small amount of free fluid is seen within the pelvic cul-de-sac.    IMPRESSION: Single live intrauterine pregnancy noted, with a crown-rump length of 2.3 mm, corresponding to a gestational age of [redacted] weeks 6 days. This matches the gestational age of [redacted] weeks 4 days by LMP, reflecting an estimated date of delivery of October 31, 2014.     Electronically Signed   By: Roanna RaiderJeffery  Chang M.D.   On: 03/04/2014 02:49    Assessment and Plan  A:  SIUP at 7158w4d       Intermittent pelvic pain  P:  Discussed with Dr Claiborne Billingsallahan      Reviewed with patient who is thrilled       Discharge home       Has appt next week in office  [facility] Center[facility]ILLIAMS,Dacotah Cabello 03/04/2014, 3:20 AM

## 2014-03-04 NOTE — MAU Note (Signed)
Lower abdominal pain since yesterday that shoots down into vagina. Denies vaginal bleeding or discharge.

## 2014-03-04 NOTE — Discharge Instructions (Signed)
Abdominal Pain During Pregnancy °Abdominal pain is common in pregnancy. Most of the time, it does not cause harm. There are many causes of abdominal pain. Some causes are more serious than others. Some of the causes of abdominal pain in pregnancy are easily diagnosed. Occasionally, the diagnosis takes time to understand. Other times, the cause is not determined. Abdominal pain can be a sign that something is very wrong with the pregnancy, or the pain may have nothing to do with the pregnancy at all. For this reason, always tell your health care provider if you have any abdominal discomfort. °HOME CARE INSTRUCTIONS  °Monitor your abdominal pain for any changes. The following actions may help to alleviate any discomfort you are experiencing: °· Do not have sexual intercourse or put anything in your vagina until your symptoms go away completely. °· Get plenty of rest until your pain improves. °· Drink clear fluids if you feel nauseous. Avoid solid food as long as you are uncomfortable or nauseous. °· Only take over-the-counter or prescription medicine as directed by your health care provider. °· Keep all follow-up appointments with your health care provider. °SEEK IMMEDIATE MEDICAL CARE IF: °· You are bleeding, leaking fluid, or passing tissue from the vagina. °· You have increasing pain or cramping. °· You have persistent vomiting. °· You have painful or bloody urination. °· You have a fever. °· You notice a decrease in your baby's movements. °· You have extreme weakness or feel faint. °· You have shortness of breath, with or without abdominal pain. °· You develop a severe headache with abdominal pain. °· You have abnormal vaginal discharge with abdominal pain. °· You have persistent diarrhea. °· You have abdominal pain that continues even after rest, or gets worse. °MAKE SURE YOU:  °· Understand these instructions. °· Will watch your condition. °· Will get help right away if you are not doing well or get  worse. °Document Released: 05/14/2005 Document Revised: 03/04/2013 Document Reviewed: 12/11/2012 °ExitCare® Patient Information ©2015 [employer]. This information is not intended to replace advice given to you by your health care provider. Make sure you discuss any questions you have with your health care provider. °First Trimester of Pregnancy °The first trimester of pregnancy is from week 1 until the end of week 12 (months 1 through 3). A week after a sperm fertilizes an egg, the egg will implant on the wall of the uterus. This embryo will begin to develop into a baby. Genes from you and your partner are forming the baby. The female genes determine whether the baby is a boy or a girl. At 6-8 weeks, the eyes and face are formed, and the heartbeat can be seen on ultrasound. At the end of 12 weeks, all the baby's organs are formed.  °Now that you are pregnant, you will want to do everything you can to have a healthy baby. Two of the most important things are to get good prenatal care and to follow your health care provider's instructions. Prenatal care is all the medical care you receive before the baby's birth. This care will help prevent, find, and treat any problems during the pregnancy and childbirth. °BODY CHANGES °Your body goes through many changes during pregnancy. The changes vary from woman to woman.  °· You may gain or lose a couple of pounds at first. °· You may feel sick to your stomach (nauseous) and throw up (vomit). If the vomiting is uncontrollable, call your health care provider. °· You may tire easily. °·   You may develop headaches that can be relieved by medicines approved by your health care provider. °· You may urinate more often. Painful urination may mean you have a bladder infection. °· You may develop heartburn as a result of your pregnancy. °· You may develop constipation because certain hormones are causing the muscles that push waste through your intestines to slow down. °· You may  develop hemorrhoids or swollen, bulging veins (varicose veins). °· Your breasts may begin to grow larger and become tender. Your nipples may stick out more, and the tissue that surrounds them (areola) may become darker. °· Your gums may bleed and may be sensitive to brushing and flossing. °· Dark spots or blotches (chloasma, mask of pregnancy) may develop on your face. This will likely fade after the baby is born. °· Your menstrual periods will stop. °· You may have a loss of appetite. °· You may develop cravings for certain kinds of food. °· You may have changes in your emotions from day to day, such as being excited to be pregnant or being concerned that something may go wrong with the pregnancy and baby. °· You may have more vivid and strange dreams. °· You may have changes in your hair. These can include thickening of your hair, rapid growth, and changes in texture. Some women also have hair loss during or after pregnancy, or hair that feels dry or thin. Your hair will most likely return to normal after your baby is born. °WHAT TO EXPECT AT YOUR PRENATAL VISITS °During a routine prenatal visit: °· You will be weighed to make sure you and the baby are growing normally. °· Your blood pressure will be taken. °· Your abdomen will be measured to track your baby's growth. °· The fetal heartbeat will be listened to starting around week 10 or 12 of your pregnancy. °· Test results from any previous visits will be discussed. °Your health care provider may ask you: °· How you are feeling. °· If you are feeling the baby move. °· If you have had any abnormal symptoms, such as leaking fluid, bleeding, severe headaches, or abdominal cramping. °· If you have any questions. °Other tests that may be performed during your first trimester include: °· Blood tests to find your blood type and to check for the presence of any previous infections. They will also be used to check for low iron levels (anemia) and Rh antibodies. Later in  the pregnancy, blood tests for diabetes will be done along with other tests if problems develop. °· Urine tests to check for infections, diabetes, or protein in the urine. °· An ultrasound to confirm the proper growth and development of the baby. °· An amniocentesis to check for possible genetic problems. °· Fetal screens for spina bifida and Down syndrome. °· You may need other tests to make sure you and the baby are doing well. °HOME CARE INSTRUCTIONS  °Medicines °· Follow your health care provider's instructions regarding medicine use. Specific medicines may be either safe or unsafe to take during pregnancy. °· Take your prenatal vitamins as directed. °· If you develop constipation, try taking a stool softener if your health care provider approves. °Diet °· Eat regular, well-balanced meals. Choose a variety of foods, such as meat or vegetable-based protein, fish, milk and low-fat dairy products, vegetables, fruits, and whole grain breads and cereals. Your health care provider will help you determine the amount of weight gain that is right for you. °· Avoid raw meat and uncooked cheese. These   carry germs that can cause birth defects in the baby. °· Eating four or five small meals rather than three large meals a day may help relieve nausea and vomiting. If you start to feel nauseous, eating a few soda crackers can be helpful. Drinking liquids between meals instead of during meals also seems to help nausea and vomiting. °· If you develop constipation, eat more high-fiber foods, such as fresh vegetables or fruit and whole grains. Drink enough fluids to keep your urine clear or pale yellow. °Activity and Exercise °· Exercise only as directed by your health care provider. Exercising will help you: °¨ Control your weight. °¨ Stay in shape. °¨ Be prepared for labor and delivery. °· Experiencing pain or cramping in the lower abdomen or low back is a good sign that you should stop exercising. Check with your health care  provider before continuing normal exercises. °· Try to avoid standing for long periods of time. Move your legs often if you must stand in one place for a long time. °· Avoid heavy lifting. °· Wear low-heeled shoes, and practice good posture. °· You may continue to have sex unless your health care provider directs you otherwise. °Relief of Pain or Discomfort °· Wear a good support bra for breast tenderness.   °· Take warm sitz baths to soothe any pain or discomfort caused by hemorrhoids. Use hemorrhoid cream if your health care provider approves.   °· Rest with your legs elevated if you have leg cramps or low back pain. °· If you develop varicose veins in your legs, wear support hose. Elevate your feet for 15 minutes, 3-4 times a day. Limit salt in your diet. °Prenatal Care °· Schedule your prenatal visits by the twelfth week of pregnancy. They are usually scheduled monthly at first, then more often in the last 2 months before delivery. °· Write down your questions. Take them to your prenatal visits. °· Keep all your prenatal visits as directed by your health care provider. °Safety °· Wear your seat belt at all times when driving. °· Make a list of emergency phone numbers, including numbers for family, friends, the hospital, and police and fire departments. °General Tips °· Ask your health care provider for a referral to a local prenatal education class. Begin classes no later than at the beginning of month 6 of your pregnancy. °· Ask for help if you have counseling or nutritional needs during pregnancy. Your health care provider can offer advice or refer you to specialists for help with various needs. °· Do not use hot tubs, steam rooms, or saunas. °· Do not douche or use tampons or scented sanitary pads. °· Do not cross your legs for long periods of time. °· Avoid cat litter boxes and soil used by cats. These carry germs that can cause birth defects in the baby and possibly loss of the fetus by miscarriage or  stillbirth. °· Avoid all smoking, herbs, alcohol, and medicines not prescribed by your health care provider. Chemicals in these affect the formation and growth of the baby. °· Schedule a dentist appointment. At home, brush your teeth with a soft toothbrush and be gentle when you floss. °SEEK MEDICAL CARE IF:  °· You have dizziness. °· You have mild pelvic cramps, pelvic pressure, or nagging pain in the abdominal area. °· You have persistent nausea, vomiting, or diarrhea. °· You have a bad smelling vaginal discharge. °· You have pain with urination. °· You notice increased swelling in your face, hands, legs, or ankles. °SEEK   IMMEDIATE MEDICAL CARE IF:  °· You have a fever. °· You are leaking fluid from your vagina. °· You have spotting or bleeding from your vagina. °· You have severe abdominal cramping or pain. °· You have rapid weight gain or loss. °· You vomit blood or material that looks like coffee grounds. °· You are exposed to German measles and have never had them. °· You are exposed to fifth disease or chickenpox. °· You develop a severe headache. °· You have shortness of breath. °· You have any kind of trauma, such as from a fall or a car accident. °Document Released: 05/08/2001 Document Revised: 09/28/2013 Document Reviewed: 03/24/2013 °ExitCare® Patient Information ©2015 [employer]. This information is not intended to replace advice given to you by your health care provider. Make sure you discuss any questions you have with your health care provider. ° °

## 2014-03-05 LAB — GC/CHLAMYDIA PROBE AMP
CT Probe RNA: NEGATIVE
GC Probe RNA: NEGATIVE

## 2014-03-07 ENCOUNTER — Encounter (HOSPITAL_COMMUNITY): Payer: Self-pay | Admitting: *Deleted

## 2014-03-07 ENCOUNTER — Inpatient Hospital Stay (HOSPITAL_COMMUNITY)
Admission: AD | Admit: 2014-03-07 | Discharge: 2014-03-07 | Disposition: A | Discharge status: Home / Self Care | Payer: Federal, State, Local not specified - PPO | Source: Ambulatory Visit | Attending: Obstetrics and Gynecology | Admitting: Obstetrics and Gynecology

## 2014-03-07 DIAGNOSIS — Z3A01 Less than 8 weeks gestation of pregnancy: Secondary | ICD-10-CM

## 2014-03-07 DIAGNOSIS — O211 Hyperemesis gravidarum with metabolic disturbance: Secondary | ICD-10-CM | POA: Insufficient documentation

## 2014-03-07 DIAGNOSIS — O210 Mild hyperemesis gravidarum: Secondary | ICD-10-CM

## 2014-03-07 DIAGNOSIS — O219 Vomiting of pregnancy, unspecified: Secondary | ICD-10-CM

## 2014-03-07 LAB — URINALYSIS, ROUTINE W REFLEX MICROSCOPIC
Glucose, UA: NEGATIVE mg/dL
HGB URINE DIPSTICK: NEGATIVE
Ketones, ur: 40 mg/dL — AB
Leukocytes, UA: NEGATIVE
Nitrite: NEGATIVE
PROTEIN: NEGATIVE mg/dL
Specific Gravity, Urine: >1.03 — ABNORMAL HIGH (ref 1.005–1.030)
UROBILINOGEN UA: 1 mg/dL (ref 0.0–1.0)
pH: 6 (ref 5.0–8.0)

## 2014-03-07 MED ORDER — PROMETHAZINE HCL 25 MG/ML IJ SOLN
25.0000 mg | Freq: Once | INTRAMUSCULAR | Status: DC
Start: 2014-03-07 — End: 2014-03-07
  Filled 2014-03-07: qty 1

## 2014-03-07 MED ORDER — PROMETHAZINE HCL 25 MG PO TABS: 25.0000 mg | ORAL_TABLET | Freq: Four times a day (QID) | ORAL | Status: DC | PRN

## 2014-03-07 NOTE — MAU Note (Signed)
Pt presents to MAU with complaints of nausea, denies any vaginal bleeding or discharge

## 2014-03-07 NOTE — Discharge Instructions (Signed)

## 2014-03-07 NOTE — MAU Provider Note (Signed)
History     CSN: [id number]  Arrival date and time: 03/07/14 1121   None     Chief Complaint  Patient presents with  . Morning Sickness   HPI This is a [age] y.o. female at 7354w0d who presents with c/o nausea and vomiting.  States has not kept anything down for days.   RN Note:  Pt presents to MAU with complaints of nausea, denies any vaginal bleeding or discharge       OB History   Grav Para Term Preterm Abortions TAB SAB Ect Mult Living   4 1 0 1 2 2 0 0 0 0       Past Medical History  Diagnosis Date  . Preterm labor     Past Surgical History  Procedure Laterality Date  . Breast lumpectomy      Family History  Problem Relation Age of Onset  . Diabetes Father     History  Substance Use Topics  . Smoking status: Never Smoker   . Smokeless tobacco: Never Used  . Alcohol Use: No    Allergies: No Known Allergies  Prescriptions prior to admission  Medication Sig Dispense Refill  . sulfamethoxazole-trimethoprim (BACTRIM DS) 800-160 MG per tablet Take 1 tablet by mouth 2 (two) times daily.  14 tablet  1    Review of Systems  Constitutional: Negative for fever, chills and malaise/fatigue.  Gastrointestinal: Positive for nausea and vomiting. Negative for abdominal pain, diarrhea and constipation.  Neurological: Positive for weakness. Negative for dizziness.   Physical Exam   Blood pressure 120/78, pulse 78, temperature 98.1 F (36.7 C), temperature source Oral, resp. rate 18, height 5\' 5"  (1.651 m), weight 166 lb (75.297 kg), last menstrual period 01/24/2014.  Physical Exam  Constitutional: She is oriented to person, place, and time. She appears well-developed and well-nourished. No distress.  HENT:  Head: Normocephalic.  Cardiovascular: Normal rate.   Respiratory: Effort normal.  GI: Soft. There is no tenderness. There is no rebound and no guarding.  Musculoskeletal: Normal range of motion.  Neurological: She is alert and oriented to person, place, and  time.  Skin: Skin is warm and dry.  Psychiatric: She has a normal mood and affect.   Results for orders placed during the hospital encounter of 03/07/14 (from the past 72 hour(s))  URINALYSIS, ROUTINE W REFLEX MICROSCOPIC     Status: Abnormal   Collection Time    03/07/14 11:28 AM      Result Value Ref Range   Color, Urine YELLOW  YELLOW   APPearance HAZY (*) CLEAR   Specific Gravity, Urine >1.030 (*) 1.005 - 1.030   pH 6.0  5.0 - 8.0   Glucose, UA NEGATIVE  NEGATIVE mg/dL   Hgb urine dipstick NEGATIVE  NEGATIVE   Bilirubin Urine SMALL (*) NEGATIVE   Ketones, ur 40 (*) NEGATIVE mg/dL   Protein, ur NEGATIVE  NEGATIVE mg/dL   Urobilinogen, UA 1.0  0.0 - 1.0 mg/dL   Nitrite NEGATIVE  NEGATIVE   Leukocytes, UA NEGATIVE  NEGATIVE   Comment: MICROSCOPIC NOT DONE ON URINES WITH NEGATIVE PROTEIN, BLOOD, LEUKOCYTES, NITRITE, OR GLUCOSE <1000 mg/dL.    MAU Course  Procedures  MDM IV fluids infused with Phenergan Good relief from nausea  Assessment and Plan  A:  SIUP at 5954w0d       Nausea and vomiting of pregnancy      Mild dehydration  P:  Rehydrated       Rx Phenergan for home PRN use  Followup in office  [facility],MARIE 03/07/2014, 11:54 AM

## 2014-03-29 ENCOUNTER — Encounter (HOSPITAL_COMMUNITY): Payer: Self-pay | Admitting: *Deleted

## 2014-03-30 ENCOUNTER — Inpatient Hospital Stay (HOSPITAL_COMMUNITY)
Admission: AD | Admit: 2014-03-30 | Discharge: 2014-03-30 | Disposition: A | Discharge status: Home / Self Care | Payer: Federal, State, Local not specified - PPO | Source: Ambulatory Visit | Attending: Obstetrics | Admitting: Obstetrics

## 2014-03-30 ENCOUNTER — Ambulatory Visit (HOSPITAL_COMMUNITY): Payer: Federal, State, Local not specified - PPO

## 2014-03-30 ENCOUNTER — Inpatient Hospital Stay (HOSPITAL_COMMUNITY): Payer: Federal, State, Local not specified - PPO

## 2014-03-30 DIAGNOSIS — Z3A09 9 weeks gestation of pregnancy: Secondary | ICD-10-CM | POA: Diagnosis not present

## 2014-03-30 DIAGNOSIS — O039 Complete or unspecified spontaneous abortion without complication: Secondary | ICD-10-CM | POA: Diagnosis not present

## 2014-03-30 DIAGNOSIS — R58 Hemorrhage, not elsewhere classified: Secondary | ICD-10-CM

## 2014-03-30 DIAGNOSIS — O209 Hemorrhage in early pregnancy, unspecified: Secondary | ICD-10-CM | POA: Diagnosis present

## 2014-03-30 LAB — CBC
HCT: 31.8 % — ABNORMAL LOW (ref 36.0–46.0)
HEMOGLOBIN: 10.8 g/dL — AB (ref 12.0–15.0)
MCH: 28.5 pg (ref 26.0–34.0)
MCHC: 34 g/dL (ref 30.0–36.0)
MCV: 83.9 fL (ref 78.0–100.0)
Platelets: 285 10*3/uL (ref 150–400)
RBC: 3.79 MIL/uL — AB (ref 3.87–5.11)
RDW: 14.7 % (ref 11.5–15.5)
WBC: 11.9 10*3/uL — ABNORMAL HIGH (ref 4.0–10.5)

## 2014-03-30 LAB — HCG, QUANTITATIVE, PREGNANCY: hCG, Beta Chain, Quant, S: 78394 m[IU]/mL — ABNORMAL HIGH (ref <5)

## 2014-03-30 LAB — TYPE AND SCREEN
ABO/RH(D): A NEG
Antibody Screen: NEGATIVE

## 2014-03-30 MED ORDER — IBUPROFEN 800 MG PO TABS: 800.0000 mg | ORAL_TABLET | Freq: Three times a day (TID) | ORAL | Status: DC | PRN

## 2014-03-30 MED ORDER — RHO D IMMUNE GLOBULIN 1500 UNIT/2ML IJ SOSY
300.0000 ug | PREFILLED_SYRINGE | Freq: Once | INTRAMUSCULAR | Status: AC
  Administered 2014-03-30: 300 ug via INTRAMUSCULAR
  Filled 2014-03-30: qty 2

## 2014-03-30 MED ORDER — HYDROCODONE-ACETAMINOPHEN 5-325 MG PO TABS: 1.0000 | ORAL_TABLET | Freq: Four times a day (QID) | ORAL | Status: DC | PRN

## 2014-03-30 MED ORDER — HYDROMORPHONE HCL 1 MG/ML IJ SOLN
1.0000 mg | Freq: Once | INTRAMUSCULAR | Status: AC
  Administered 2014-03-30: 1 mg via INTRAMUSCULAR
  Filled 2014-03-30: qty 1

## 2014-03-30 NOTE — MAU Note (Signed)
Kristin ShellerHeather Medina CNM in to talk with pt about miscarriage. Pt and SO very upset and crying. Pt asking if they can be left alone for a while.

## 2014-03-30 NOTE — MAU Provider Note (Signed)
History     CSN: [id number]  Arrival date and time: 03/30/14 0431   None     Chief Complaint  Patient presents with  . Abdominal Pain   HPI  Kristin Medina is a 23 y.o. G4P0120 at 8230w2d who presents today with vaginal bleeding. She states that she had some light bleeding throughout the day yesterday. Then overnight she states that the bleeding became much heavier, and she started to have 10/10 cramping pain. She states that she has passed several large clots. She is unsure if she has passed any tissue.   Past Medical History  Diagnosis Date  . Preterm labor     Past Surgical History  Procedure Laterality Date  . Breast lumpectomy      Family History  Problem Relation Age of Onset  . Diabetes Father     History  Substance Use Topics  . Smoking status: Never Smoker   . Smokeless tobacco: Never Used  . Alcohol Use: No    Allergies: No Known Allergies  Prescriptions prior to admission  Medication Sig Dispense Refill Last Dose  . promethazine (PHENERGAN) 25 MG tablet Take 1 tablet (25 mg total) by mouth every 6 (six) hours as needed for nausea or vomiting. 30 tablet 2   . sulfamethoxazole-trimethoprim (BACTRIM DS) 800-160 MG per tablet Take 1 tablet by mouth 2 (two) times daily. Pt started on 03-04-14,  to take for five days   03/06/2014 at Unknown time    ROS Physical Exam   Temperature 97.9 F (36.6 C), temperature source Oral, resp. rate 18, last menstrual period 01/24/2014.  Physical Exam  Nursing note and vitals reviewed. Constitutional: She is oriented to person, place, and time. She appears well-developed and well-nourished. No distress.  Cardiovascular: Normal rate.   Respiratory: Effort normal.  GI: Soft. There is no tenderness. There is no rebound.  Neurological: She is alert and oriented to person, place, and time.  Skin: Skin is warm and dry.  Psychiatric: She has a normal mood and affect.  Pelvic exam deferred due patient's level of pain.   MAU  Course  Procedures  Results for orders placed or performed during the hospital encounter of 03/30/14 (from the past 24 hour(s))  Type and screen     Status: None   Collection Time: 03/30/14  5:15 AM  Result Value Ref Range   ABO/RH(D) A NEG    Antibody Screen NEG    Sample Expiration 04/02/2014   CBC     Status: Abnormal   Collection Time: 03/30/14  5:15 AM  Result Value Ref Range   WBC 11.9 (H) 4.0 - 10.5 K/uL   RBC 3.79 (L) 3.87 - 5.11 MIL/uL   Hemoglobin 10.8 (L) 12.0 - 15.0 g/dL   HCT 14.731.8 (L) 82.936.0 - 56.246.0 %   MCV 83.9 78.0 - 100.0 fL   MCH 28.5 26.0 - 34.0 pg   MCHC 34.0 30.0 - 36.0 g/dL   RDW 13.014.7 86.511.5 - 78.415.5 %   Platelets 285 150 - 400 K/uL  hCG, quantitative, pregnancy     Status: Abnormal   Collection Time: 03/30/14  5:15 AM  Result Value Ref Range   hCG, Beta Chain, Quant, S [id number] (H) <5 mIU/mL  Rh IG workup (includes ABO/Rh)     Status: None (Preliminary result)   Collection Time: 03/30/14  5:15 AM  Result Value Ref Range   Gestational Age(Wks) 9    ABO/RH(D) A NEG    Unit Number [id number]  Blood Component Type RHIG    Unit division 00    Status of Unit ALLOCATED    Transfusion Status OK TO TRANSFUSE    [place] Ob Transvaginal  03/30/2014   CLINICAL DATA:  Vaginal bleeding since 6 p.m. yesterday. Low abdominal pain. Estimated gestational age by LMP is 9 weeks 2 days. Quantitative beta HCG is in progress.  EXAM: TRANSVAGINAL OB ULTRASOUND  TECHNIQUE: Transvaginal ultrasound was performed for complete evaluation of the gestation as well as the maternal uterus, adnexal regions, and pelvic cul-de-sac.  COMPARISON:  03/04/2014  FINDINGS: Intrauterine gestational sac: No intrauterine gestational sac identified.  Yolk sac:  Not identified.  Embryo:  Not identified  Cardiac Activity: Not identified  Maternal uterus/adnexae: Uterus is not significantly enlarged. Endometrium is somewhat expanded with heterogeneous hyperechoic material present. No flow is demonstrated on color  flow Doppler imaging, suggesting blood clots. Right ovary is identified and appears normal. Left ovary is not visualized. No free pelvic fluid collections.  IMPRESSION: No intrauterine gestational sac identified. Intrauterine gestational sac was present on previous study. Findings meet definitive criteria for failed pregnancy. This follows SRU consensus guidelines: Diagnostic Criteria for Nonviable Pregnancy Early in the First Trimester. Macy Mis Engl J Med [phone].   Electronically Signed   By: Burman NievesWilliam  Stevens M.D.   On: 03/30/2014 05:58   0656: D/W Dr. Chestine Sporelark, will send home with motrin and pain medication at this time. Does not feel she needs cytotec at this time. Will give the patient precautions to monitor bleeding and temp. FU in the office early next week or sooner for increased bleeding, pain or temp.  Assessment and Plan   1. SAB (spontaneous abortion)   2. Bleeding    Bleeding precautions  Monitor temp at home FU in the office early next week or sooner for increased pain, bleeding, or temp Rhogam given today    Medication List    TAKE these medications        HYDROcodone-acetaminophen 5-325 MG per tablet  Commonly known as:  NORCO/VICODIN  Take 1-2 tablets by mouth every 6 (six) hours as needed for moderate pain.     ibuprofen 800 MG tablet  Commonly known as:  ADVIL,MOTRIN  Take 1 tablet (800 mg total) by mouth every 8 (eight) hours as needed.     promethazine 25 MG tablet  Commonly known as:  PHENERGAN  Take 1 tablet (25 mg total) by mouth every 6 (six) hours as needed for nausea or vomiting.     sulfamethoxazole-trimethoprim 800-160 MG per tablet  Commonly known as:  BACTRIM DS  Take 1 tablet by mouth 2 (two) times daily. Pt started on 03-04-14,  to take for five days       Follow-up Information    Follow up with Marlow BaarsLARK, DYANNA, MD.   Specialty:  Obstetrics   Why:  CALL FOR AN APPOINTMENT AT THE BEGINNING OF NEXT WEEK.    Contact information:   [address] [place] [id number] [phone]      Tawnya CrookHogan, Lotus Gover Donovan 03/30/2014, 7:14 AM

## 2014-03-30 NOTE — Discharge Instructions (Signed)
Miscarriage A miscarriage is the sudden loss of an unborn baby (fetus) before the 20th week of pregnancy. Most miscarriages happen in the first 3 months of pregnancy. Sometimes, it happens before a woman even knows she is pregnant. A miscarriage is also called a "spontaneous miscarriage" or "early pregnancy loss." Having a miscarriage can be an emotional experience. Talk with your caregiver about any questions you may have about miscarrying, the grieving process, and your future pregnancy plans. CAUSES   Problems with the fetal chromosomes that make it impossible for the baby to develop normally. Problems with the baby's genes or chromosomes are most often the result of errors that occur, by chance, as the embryo divides and grows. The problems are not inherited from the parents.  Infection of the cervix or uterus.   Hormone problems.   Problems with the cervix, such as having an incompetent cervix. This is when the tissue in the cervix is not strong enough to hold the pregnancy.   Problems with the uterus, such as an abnormally shaped uterus, uterine fibroids, or congenital abnormalities.   Certain medical conditions.   Smoking, drinking alcohol, or taking illegal drugs.   Trauma.  Often, the cause of a miscarriage is unknown.  SYMPTOMS   Vaginal bleeding or spotting, with or without cramps or pain.  Pain or cramping in the abdomen or lower back.  Passing fluid, tissue, or blood clots from the vagina. DIAGNOSIS  Your caregiver will perform a physical exam. You may also have an ultrasound to confirm the miscarriage. Blood or urine tests may also be ordered. TREATMENT   Sometimes, treatment is not necessary if you naturally pass all the fetal tissue that was in the uterus. If some of the fetus or placenta remains in the body (incomplete miscarriage), tissue left behind may become infected and must be removed. Usually, a dilation and curettage (D and C) procedure is performed.  During a D and C procedure, the cervix is widened (dilated) and any remaining fetal or placental tissue is gently removed from the uterus.  Antibiotic medicines are prescribed if there is an infection. Other medicines may be given to reduce the size of the uterus (contract) if there is a lot of bleeding.  If you have Rh negative blood and your baby was Rh positive, you will need a Rh immunoglobulin shot. This shot will protect any future baby from having Rh blood problems in future pregnancies. HOME CARE INSTRUCTIONS   Your caregiver may order bed rest or may allow you to continue light activity. Resume activity as directed by your caregiver.  Have someone help with home and family responsibilities during this time.   Keep track of the number of sanitary pads you use each day and how soaked (saturated) they are. Write down this information.   Do not use tampons. Do not douche or have sexual intercourse until approved by your caregiver.   Only take over-the-counter or prescription medicines for pain or discomfort as directed by your caregiver.   Do not take aspirin. Aspirin can cause bleeding.   Keep all follow-up appointments with your caregiver.   If you or your partner have problems with grieving, talk to your caregiver or seek counseling to help cope with the pregnancy loss. Allow enough time to grieve before trying to get pregnant again.  SEEK IMMEDIATE MEDICAL CARE IF:   You have severe cramps or pain in your back or abdomen.  You have a fever.  You pass large blood clots (walnut-sized   or larger) ortissue from your vagina. Save any tissue for your caregiver to inspect.   Your bleeding increases.   You have a thick, bad-smelling vaginal discharge.  You become lightheaded, weak, or you faint.   You have chills.  MAKE SURE YOU:  Understand these instructions.  Will watch your condition.  Will get help right away if you are not doing well or get  worse. Document Released: 11/07/2000 Document Revised: 09/08/2012 Document Reviewed: 07/03/2011 Exit[employer] Patient Information 2015 [employer]. This information is not intended to replace advice given to you by your health care provider. Make sure you discuss any questions you have with your health care provider.  

## 2014-03-30 NOTE — MAU Note (Signed)
Pt states that she began to experience bleeding earlier on in the day with some intermittent cramping. Into the evening, the pain became worse and pt states she was passing the occasional clot. In the past 3-4 hours, pt has been bleeding and needing a pad on all the time and passing clots with severe pain.

## 2014-03-31 LAB — RH IG WORKUP (INCLUDES ABO/RH)
ABO/RH(D): A NEG
Gestational Age(Wks): 9
Unit division: 0

## 2014-04-08 ENCOUNTER — Encounter (HOSPITAL_COMMUNITY): Payer: Self-pay | Admitting: Obstetrics and Gynecology

## 2014-04-20 ENCOUNTER — Ambulatory Visit (HOSPITAL_COMMUNITY): Payer: Federal, State, Local not specified - PPO | Attending: Obstetrics and Gynecology

## 2015-01-07 ENCOUNTER — Encounter (HOSPITAL_COMMUNITY): Payer: Self-pay | Admitting: *Deleted

## 2016-01-02 ENCOUNTER — Encounter (HOSPITAL_COMMUNITY): Payer: Self-pay | Admitting: *Deleted

## 2016-01-02 ENCOUNTER — Inpatient Hospital Stay (HOSPITAL_COMMUNITY)
Admission: AD | Admit: 2016-01-02 | Discharge: 2016-01-02 | Disposition: A | Discharge status: Home / Self Care | Payer: Medicaid Other | Source: Ambulatory Visit | Attending: Obstetrics & Gynecology | Admitting: Obstetrics & Gynecology

## 2016-01-02 ENCOUNTER — Inpatient Hospital Stay (HOSPITAL_COMMUNITY): Payer: Medicaid Other

## 2016-01-02 DIAGNOSIS — N83292 Other ovarian cyst, left side: Secondary | ICD-10-CM | POA: Insufficient documentation

## 2016-01-02 DIAGNOSIS — O219 Vomiting of pregnancy, unspecified: Secondary | ICD-10-CM | POA: Insufficient documentation

## 2016-01-02 DIAGNOSIS — B9689 Other specified bacterial agents as the cause of diseases classified elsewhere: Secondary | ICD-10-CM

## 2016-01-02 DIAGNOSIS — O98812 Other maternal infectious and parasitic diseases complicating pregnancy, second trimester: Secondary | ICD-10-CM | POA: Insufficient documentation

## 2016-01-02 DIAGNOSIS — B373 Candidiasis of vulva and vagina: Secondary | ICD-10-CM | POA: Diagnosis not present

## 2016-01-02 DIAGNOSIS — O26892 Other specified pregnancy related conditions, second trimester: Secondary | ICD-10-CM | POA: Diagnosis not present

## 2016-01-02 DIAGNOSIS — R1030 Lower abdominal pain, unspecified: Secondary | ICD-10-CM | POA: Insufficient documentation

## 2016-01-02 DIAGNOSIS — N760 Acute vaginitis: Secondary | ICD-10-CM | POA: Diagnosis not present

## 2016-01-02 DIAGNOSIS — O26899 Other specified pregnancy related conditions, unspecified trimester: Secondary | ICD-10-CM

## 2016-01-02 DIAGNOSIS — O98813 Other maternal infectious and parasitic diseases complicating pregnancy, third trimester: Secondary | ICD-10-CM

## 2016-01-02 DIAGNOSIS — Z3201 Encounter for pregnancy test, result positive: Secondary | ICD-10-CM | POA: Diagnosis not present

## 2016-01-02 DIAGNOSIS — R197 Diarrhea, unspecified: Secondary | ICD-10-CM | POA: Insufficient documentation

## 2016-01-02 DIAGNOSIS — Z8751 Personal history of pre-term labor: Secondary | ICD-10-CM | POA: Insufficient documentation

## 2016-01-02 DIAGNOSIS — R109 Unspecified abdominal pain: Secondary | ICD-10-CM

## 2016-01-02 DIAGNOSIS — Z3491 Encounter for supervision of normal pregnancy, unspecified, first trimester: Secondary | ICD-10-CM

## 2016-01-02 DIAGNOSIS — E876 Hypokalemia: Secondary | ICD-10-CM

## 2016-01-02 DIAGNOSIS — B3731 Acute candidiasis of vulva and vagina: Secondary | ICD-10-CM

## 2016-01-02 DIAGNOSIS — Z3A01 Less than 8 weeks gestation of pregnancy: Secondary | ICD-10-CM

## 2016-01-02 LAB — COMPREHENSIVE METABOLIC PANEL
ALBUMIN: 4.6 g/dL (ref 3.5–5.0)
ALK PHOS: 50 U/L (ref 38–126)
ALT: 11 U/L — ABNORMAL LOW (ref 14–54)
ANION GAP: 9 (ref 5–15)
AST: 11 U/L — ABNORMAL LOW (ref 15–41)
BILIRUBIN TOTAL: 0.6 mg/dL (ref 0.3–1.2)
BUN: 7 mg/dL (ref 6–20)
CALCIUM: 9.1 mg/dL (ref 8.9–10.3)
CO2: 24 mmol/L (ref 22–32)
Chloride: 101 mmol/L (ref 101–111)
Creatinine, Ser: 0.62 mg/dL (ref 0.44–1.00)
GFR calc Af Amer: >60 mL/min (ref >60)
GLUCOSE: 85 mg/dL (ref 65–99)
POTASSIUM: 3.1 mmol/L — AB (ref 3.5–5.1)
Sodium: 134 mmol/L — ABNORMAL LOW (ref 135–145)
TOTAL PROTEIN: 8.4 g/dL — AB (ref 6.5–8.1)

## 2016-01-02 LAB — POCT PREGNANCY, URINE: Preg Test, Ur: POSITIVE — AB

## 2016-01-02 LAB — URINALYSIS, ROUTINE W REFLEX MICROSCOPIC
Bilirubin Urine: NEGATIVE
GLUCOSE, UA: NEGATIVE mg/dL
Ketones, ur: 40 mg/dL — AB
LEUKOCYTES UA: NEGATIVE
Nitrite: NEGATIVE
PROTEIN: NEGATIVE mg/dL
Specific Gravity, Urine: >1.03 — ABNORMAL HIGH (ref 1.005–1.030)
pH: 6 (ref 5.0–8.0)

## 2016-01-02 LAB — WET PREP, GENITAL
Sperm: NONE SEEN
TRICH WET PREP: NONE SEEN

## 2016-01-02 LAB — URINE MICROSCOPIC-ADD ON: WBC, UA: NONE SEEN WBC/hpf (ref 0–5)

## 2016-01-02 LAB — HCG, QUANTITATIVE, PREGNANCY: hCG, Beta Chain, Quant, S: 16120 m[IU]/mL — ABNORMAL HIGH (ref <5)

## 2016-01-02 LAB — CBC
HCT: 35.2 % — ABNORMAL LOW (ref 36.0–46.0)
HEMOGLOBIN: 12 g/dL (ref 12.0–15.0)
MCH: 27.3 pg (ref 26.0–34.0)
MCHC: 34.1 g/dL (ref 30.0–36.0)
MCV: 80.2 fL (ref 78.0–100.0)
Platelets: 305 10*3/uL (ref 150–400)
RBC: 4.39 MIL/uL (ref 3.87–5.11)
RDW: 16.5 % — ABNORMAL HIGH (ref 11.5–15.5)
WBC: 9 10*3/uL (ref 4.0–10.5)

## 2016-01-02 LAB — ABO/RH: ABO/RH(D): A NEG

## 2016-01-02 MED ORDER — TERCONAZOLE 0.8 % VA CREA: 1.0000 | TOPICAL_CREAM | Freq: Every day | VAGINAL | 0 refills | Status: DC

## 2016-01-02 MED ORDER — METOCLOPRAMIDE HCL 5 MG/ML IJ SOLN
10.0000 mg | Freq: Once | INTRAMUSCULAR | Status: AC
  Administered 2016-01-02: 10 mg via INTRAVENOUS
  Filled 2016-01-02: qty 2

## 2016-01-02 MED ORDER — PROMETHAZINE HCL 12.5 MG PO TABS: 12.5000 mg | ORAL_TABLET | Freq: Four times a day (QID) | ORAL | 0 refills | Status: DC | PRN

## 2016-01-02 MED ORDER — LACTATED RINGERS IV BOLUS (SEPSIS)
1000.0000 mL | Freq: Once | INTRAVENOUS | Status: AC
  Administered 2016-01-02: 1000 mL via INTRAVENOUS

## 2016-01-02 MED ORDER — METRONIDAZOLE 500 MG PO TABS: 500.0000 mg | ORAL_TABLET | Freq: Two times a day (BID) | ORAL | 0 refills | Status: DC

## 2016-01-02 MED ORDER — POTASSIUM CHLORIDE CRYS ER 20 MEQ PO TBCR: 20.0000 meq | EXTENDED_RELEASE_TABLET | Freq: Two times a day (BID) | ORAL | 0 refills | Status: DC

## 2016-01-02 NOTE — Discharge Instructions (Signed)
Morning Sickness Morning sickness is when you feel sick to your stomach (nauseous) during pregnancy. You may feel sick to your stomach and throw up (vomit). You may feel sick in the morning, but you can feel this way any time of day. Some women feel very sick to their stomach and cannot stop throwing up (hyperemesis gravidarum). HOME CARE  Only take medicines as told by your doctor.  Take multivitamins as told by your doctor. Taking multivitamins before getting pregnant can stop or lessen the harshness of morning sickness.  Eat dry toast or unsalted crackers before getting out of bed.  Eat 5 to 6 small meals a day.  Eat dry and bland foods like rice and baked potatoes.  Do not drink liquids with meals. Drink between meals.  Do not eat greasy, fatty, or spicy foods.  Have someone cook for you if the smell of food causes you to feel sick or throw up.  If you feel sick to your stomach after taking prenatal vitamins, take them at night or with a snack.  Eat protein when you need a snack (nuts, yogurt, cheese).  Eat unsweetened gelatins for dessert.  Wear a bracelet used for sea sickness (acupressure wristband).  Go to a doctor that puts thin needles into certain body points (acupuncture) to improve how you feel.  Do not smoke.  Use a humidifier to keep the air in your house free of odors.  Get lots of fresh air. GET HELP IF:  You need medicine to feel better.  You feel dizzy or lightheaded.  You are losing weight. GET HELP RIGHT AWAY IF:   You feel very sick to your stomach and cannot stop throwing up.  You pass out (faint). MAKE SURE YOU:  Understand these instructions.  Will watch your condition.  Will get help right away if you are not doing well or get worse.   This information is not intended to replace advice given to you by your health care provider. Make sure you discuss any questions you have with your health care provider.   Document Released: 06/21/2004  Document Revised: 06/04/2014 Document Reviewed: 10/29/2012 Elsevier Interactive Patient Education 2016 [employer] Abdominal Pain During Pregnancy Belly (abdominal) pain is common during pregnancy. Most of the time, it is not a serious problem. Other times, it can be a sign that something is wrong with the pregnancy. Always tell your doctor if you have belly pain. HOME CARE Monitor your belly pain for any changes. The following actions may help you feel better:  Do not have sex (intercourse) or put anything in your vagina until you feel better.  Rest until your pain stops.  Drink clear fluids if you feel sick to your stomach (nauseous). Do not eat solid food until you feel better.  Only take medicine as told by your doctor.  Keep all doctor visits as told. GET HELP RIGHT AWAY IF:   You are bleeding, leaking fluid, or pieces of tissue come out of your vagina.  You have more pain or cramping.  You keep throwing up (vomiting).  You have pain when you pee (urinate) or have blood in your pee.  You have a fever.  You do not feel your baby moving as much.  You feel very weak or feel like passing out.  You have trouble breathing, with or without belly pain.  You have a very bad headache and belly pain.  You have fluid leaking from your vagina and belly pain.  You keep  having watery poop (diarrhea).  Your belly pain does not go away after resting, or the pain gets worse. MAKE SURE YOU:   Understand these instructions.  Will watch your condition.  Will get help right away if you are not doing well or get worse.   This information is not intended to replace advice given to you by your health care provider. Make sure you discuss any questions you have with your health care provider.   Document Released: 05/02/2009 Document Revised: 01/14/2013 Document Reviewed: 12/11/2012 Elsevier Interactive Patient Education 2016 [employer].   Hypokalemia Hypokalemia means that the  amount of potassium in the blood is lower than normal.Potassium is a chemical, called an electrolyte, that helps regulate the amount of fluid in the body. It also stimulates muscle contraction and helps nerves function properly.Most of the body's potassium is inside of cells, and only a very small amount is in the blood. Because the amount in the blood is so small, minor changes can be life-threatening. CAUSES  Antibiotics.  Diarrhea or vomiting.  Using laxatives too much, which can cause diarrhea.  Chronic kidney disease.  Water pills (diuretics).  Eating disorders (bulimia).  Low magnesium level.  Sweating a lot. SIGNS AND SYMPTOMS  Weakness.  Constipation.  Fatigue.  Muscle cramps.  Mental confusion.  Skipped heartbeats or irregular heartbeat (palpitations).  Tingling or numbness. DIAGNOSIS  Your health care provider can diagnose hypokalemia with blood tests. In addition to checking your potassium level, your health care provider may also check other lab tests. TREATMENT Hypokalemia can be treated with potassium supplements taken by mouth or adjustments in your current medicines. If your potassium level is very low, you may need to get potassium through a vein (IV) and be monitored in the hospital. A diet high in potassium is also helpful. Foods high in potassium are:  Nuts, such as peanuts and pistachios.  Seeds, such as sunflower seeds and pumpkin seeds.  Peas, lentils, and lima beans.  Whole grain and bran cereals and breads.  Fresh fruit and vegetables, such as apricots, avocado, bananas, cantaloupe, kiwi, oranges, tomatoes, asparagus, and potatoes.  Orange and tomato juices.  Red meats.  Fruit yogurt. HOME CARE INSTRUCTIONS  Take all medicines as prescribed by your health care provider.  Maintain a healthy diet by including nutritious food, such as fruits, vegetables, nuts, whole grains, and lean meats.  If you are taking a laxative, be sure to  follow the directions on the label. SEEK MEDICAL CARE IF:  Your weakness gets worse.  You feel your heart pounding or racing.  You are vomiting or having diarrhea.  You are diabetic and having trouble keeping your blood glucose in the normal range. SEEK IMMEDIATE MEDICAL CARE IF:  You have chest pain, shortness of breath, or dizziness.  You are vomiting or having diarrhea for more than 2 days.  You faint. MAKE SURE YOU:   Understand these instructions.  Will watch your condition.  Will get help right away if you are not doing well or get worse.   This information is not intended to replace advice given to you by your health care provider. Make sure you discuss any questions you have with your health care provider.   Document Released: 05/14/2005 Document Revised: 06/04/2014 Document Reviewed: 11/14/2012 Elsevier Interactive Patient Education [employer]

## 2016-01-02 NOTE — MAU Note (Signed)
+  HPT x2 on Friday.  Since then has been throwing up, yesterday had diarrhea. Kind of cramping, "sharp". Hx of SAB, is making her nervous.

## 2016-01-02 NOTE — MAU Provider Note (Signed)
History     CSN: [id number]  Arrival date and time: 01/02/16 1224   First Provider Initiated Contact with Patient 01/02/16 1505      Chief Complaint  Patient presents with  . Abdominal Pain  . Emesis During Pregnancy   HPI  Kristin Medina is a 25 y.o. [id number] at [redacted]w[redacted]d by LMP who presents with n/v & abdominal pain. Had 2 positive HPTs on Friday. Reports vomiting since Friday. Has vomited 5 times in the last 24 hours. Tried to eat crackers & water today but didn't tolerate it. Reports 3 episodes of loose stool yesterday, none today. Denies vaginal bleeding, vaginal discharge, dysuria, or fever/chills.  Reports lower abdominal pain that is intermittent & sharp. Pain comes several times per day and lasts for seconds at a time. Currently in no pain. Has not treated pain.   OB History    Gravida Para Term Preterm AB Living   5 1 0 1 3 0   SAB TAB Ectopic Multiple Live Births   1 2 0 0        Past Medical History:  Diagnosis Date  . Preterm labor     Past Surgical History:  Procedure Laterality Date  . BREAST LUMPECTOMY      Family History  Problem Relation Age of Onset  . Diabetes Father     Social History  Substance Use Topics  . Smoking status: Never Smoker  . Smokeless tobacco: Never Used  . Alcohol use No    Allergies: No Known Allergies  Prescriptions Prior to Admission  Medication Sig Dispense Refill Last Dose  . HYDROcodone-acetaminophen (NORCO/VICODIN) 5-325 MG per tablet Take 1-2 tablets by mouth every 6 (six) hours as needed for moderate pain. 20 tablet 0   . ibuprofen (ADVIL,MOTRIN) 800 MG tablet Take 1 tablet (800 mg total) by mouth every 8 (eight) hours as needed. 30 tablet 0   . promethazine (PHENERGAN) 25 MG tablet Take 1 tablet (25 mg total) by mouth every 6 (six) hours as needed for nausea or vomiting. 30 tablet 2   . sulfamethoxazole-trimethoprim (BACTRIM DS) 800-160 MG per tablet Take 1 tablet by mouth 2 (two) times daily. Pt started on 03-04-14,  to  take for five days   03/06/2014 at Unknown time    Review of Systems  Constitutional: Negative.   Gastrointestinal: Positive for abdominal pain, diarrhea, nausea and vomiting. Negative for blood in stool, constipation, heartburn and melena.  Genitourinary: Negative.    Physical Exam   Blood pressure 121/75, pulse 73, temperature 97.7 F (36.5 C), temperature source Oral, resp. rate 18, height 5' 3.5" (1.613 m), weight 151 lb (68.5 kg), last menstrual period 11/26/2015, unknown if currently breastfeeding.  Physical Exam  Nursing note and vitals reviewed. Constitutional: She is oriented to person, place, and time. She appears well-developed and well-nourished. No distress.  HENT:  Head: Normocephalic and atraumatic.  Eyes: Conjunctivae are normal. Right eye exhibits no discharge. Left eye exhibits no discharge. No scleral icterus.  Neck: Normal range of motion.  Cardiovascular: Normal rate, regular rhythm and normal heart sounds.   No murmur heard. Respiratory: Effort normal and breath sounds normal. No respiratory distress. She has no wheezes.  GI: Soft. Bowel sounds are normal. She exhibits no distension. There is no tenderness. There is no rebound.  Genitourinary: Uterus normal. Cervix exhibits no motion tenderness and no friability. Right adnexum displays no mass and no tenderness. Left adnexum displays no mass and no tenderness. No bleeding in the vagina.  Vaginal discharge (small amount of thin white discharge) found.  Genitourinary Comments: Cervix closed  Neurological: She is alert and oriented to person, place, and time.  Skin: Skin is warm and dry. She is not diaphoretic.  Psychiatric: She has a normal mood and affect. Her behavior is normal. Judgment and thought content normal.    MAU Course  Procedures Results for orders placed or performed during the hospital encounter of 01/02/16 (from the past 24 hour(s))  Urinalysis, Routine w reflex microscopic (not [facility])      Status: Abnormal   Collection Time: 01/02/16 12:39 PM  Result Value Ref Range   Color, Urine YELLOW YELLOW   APPearance HAZY (A) CLEAR   Specific Gravity, Urine >1.030 (H) 1.005 - 1.030   pH 6.0 5.0 - 8.0   Glucose, UA NEGATIVE NEGATIVE mg/dL   Hgb urine dipstick TRACE (A) NEGATIVE   Bilirubin Urine NEGATIVE NEGATIVE   Ketones, ur 40 (A) NEGATIVE mg/dL   Protein, ur NEGATIVE NEGATIVE mg/dL   Nitrite NEGATIVE NEGATIVE   Leukocytes, UA NEGATIVE NEGATIVE  Urine microscopic-add on     Status: Abnormal   Collection Time: 01/02/16 12:39 PM  Result Value Ref Range   Squamous Epithelial / LPF 0-5 (A) NONE SEEN   WBC, UA NONE SEEN 0 - 5 WBC/hpf   RBC / HPF 0-5 0 - 5 RBC/hpf   Bacteria, UA FEW (A) NONE SEEN   Urine-Other MUCOUS PRESENT   Pregnancy, urine POC     Status: Abnormal   Collection Time: 01/02/16 12:45 PM  Result Value Ref Range   Preg Test, Ur POSITIVE (A) NEGATIVE  CBC     Status: Abnormal   Collection Time: 01/02/16  1:24 PM  Result Value Ref Range   WBC 9.0 4.0 - 10.5 K/uL   RBC 4.39 3.87 - 5.11 MIL/uL   Hemoglobin 12.0 12.0 - 15.0 g/dL   HCT 16.135.2 (L) 09.636.0 - 04.546.0 %   MCV 80.2 78.0 - 100.0 fL   MCH 27.3 26.0 - 34.0 pg   MCHC 34.1 30.0 - 36.0 g/dL   RDW 40.916.5 (H) 81.111.5 - 91.415.5 %   Platelets 305 150 - 400 K/uL  ABO/Rh     Status: None   Collection Time: 01/02/16  1:24 PM  Result Value Ref Range   ABO/RH(D) A NEG   hCG, quantitative, pregnancy     Status: Abnormal   Collection Time: 01/02/16  1:24 PM  Result Value Ref Range   hCG, Beta Chain, Quant, S 16,120 (H) <5 mIU/mL  Comprehensive metabolic panel     Status: Abnormal   Collection Time: 01/02/16  1:24 PM  Result Value Ref Range   Sodium 134 (L) 135 - 145 mmol/L   Potassium 3.1 (L) 3.5 - 5.1 mmol/L   Chloride 101 101 - 111 mmol/L   CO2 24 22 - 32 mmol/L   Glucose, Bld 85 65 - 99 mg/dL   BUN 7 6 - 20 mg/dL   Creatinine, Ser 7.820.62 0.44 - 1.00 mg/dL   Calcium 9.1 8.9 - 95.610.3 mg/dL   Total Protein 8.4 (H) 6.5 - 8.1  g/dL   Albumin 4.6 3.5 - 5.0 g/dL   AST 11 (L) 15 - 41 U/L   ALT 11 (L) 14 - 54 U/L   Alkaline Phosphatase 50 38 - 126 U/L   Total Bilirubin 0.6 0.3 - 1.2 mg/dL   GFR calc non Af Amer >60 >60 mL/min   GFR calc Af Amer >60 >60 mL/min  Anion gap 9 5 - 15  Wet prep, genital     Status: Abnormal   Collection Time: 01/02/16  3:17 PM  Result Value Ref Range   Yeast Wet Prep HPF POC PRESENT (A) NONE SEEN   Trich, Wet Prep NONE SEEN NONE SEEN   Clue Cells Wet Prep HPF POC PRESENT (A) NONE SEEN   WBC, Wet Prep HPF POC MODERATE (A) NONE SEEN   Sperm NONE SEEN    US Ob Comp Less 14 Wks  Result Date: 01/02/2016 CLINICAL DATA:  25 year old female with pain in the first trimester pregnancy. Quantitative beta HCG 16,120. Initial encounter. EXAM: OBSTETRIC <14 WK [place] AND TRANSVAGINAL OB US TECHNIQUE: Both transabdominal and transvaginal ultrasound examinations were performed for complete evaluation of the gestation as well as the maternal uterus, adnexal regions, and pelvic cul-de-sac. Transvaginal technique was performed to assess early pregnancy. COMPARISON:  None relevant FINDINGS: Intrauterine gestational sac: Single Yolk sac:  Visible Embryo:  Visible Cardiac Activity: Detected Heart Rate: 101  bpm CRL:  2.5  mm   5 w   6 d                  [place] EDC: 08/28/2016 Subchorionic hemorrhage:  None visualized. Maternal uterus/adnexae: The right ovary appears normal measuring 2.3 x 2.1 x 2.6 cm. The left ovary contains a 3.4 cm simple cyst and otherwise appears normal measuring 5.5 x 3.4 x 2.2 cm. Small volume simple appearing pelvic free fluid. IMPRESSION: 1. Single living IUP demonstrated. No acute maternal findings visualized. 2. A 3.4 cm simple appearing left ovarian cyst and small volume pelvic free fluid likely are physiologic. Electronically Signed   By: Odessa Fleming M.D.   On: 01/02/2016 14:49   US Ob Transvaginal  Result Date: 01/02/2016 CLINICAL DATA:  25 year old female with pain in the first trimester  pregnancy. Quantitative beta HCG 16,120. Initial encounter. EXAM: OBSTETRIC <14 WK [place] AND TRANSVAGINAL OB US TECHNIQUE: Both transabdominal and transvaginal ultrasound examinations were performed for complete evaluation of the gestation as well as the maternal uterus, adnexal regions, and pelvic cul-de-sac. Transvaginal technique was performed to assess early pregnancy. COMPARISON:  None relevant FINDINGS: Intrauterine gestational sac: Single Yolk sac:  Visible Embryo:  Visible Cardiac Activity: Detected Heart Rate: 101  bpm CRL:  2.5  mm   5 w   6 d                  [place] EDC: 08/28/2016 Subchorionic hemorrhage:  None visualized. Maternal uterus/adnexae: The right ovary appears normal measuring 2.3 x 2.1 x 2.6 cm. The left ovary contains a 3.4 cm simple cyst and otherwise appears normal measuring 5.5 x 3.4 x 2.2 cm. Small volume simple appearing pelvic free fluid. IMPRESSION: 1. Single living IUP demonstrated. No acute maternal findings visualized. 2. A 3.4 cm simple appearing left ovarian cyst and small volume pelvic free fluid likely are physiologic. Electronically Signed   By: Odessa Fleming M.D.   On: 01/02/2016 14:49    MDM UPT positive CBC, BHCG, GC/CT, wet prep, ultrasound A negative -- no bleeding Ultrasound shows SIUP with cardiac activity & left simple ovarian cyst IV LR bolus Reglan 10 mg IV (no phenergan as pt drover herself here) Pt reports improvement in symptoms  Assessment and Plan  A: 1. Vaginal yeast infection   2. Abdominal pain affecting pregnancy   3. BV (bacterial vaginosis)   4. Normal IUP (intrauterine pregnancy) on prenatal ultrasound, first trimester   5. Nausea  and vomiting during pregnancy prior to [redacted] weeks gestation   6. Hypokalemia    P: Discharge home Rx terazol, flagyl, phenergan, Kdur Plans on prenatal care with [facility] -- contact info given Discussed reasons to return to MAU   Judeth Horn 01/02/2016, 3:05 PM

## 2016-01-03 LAB — CULTURE, OB URINE: CULTURE: NO GROWTH

## 2016-01-03 LAB — GC/CHLAMYDIA PROBE AMP (~~LOC~~) NOT AT ARMC
Chlamydia: NEGATIVE
NEISSERIA GONORRHEA: NEGATIVE

## 2016-02-08 ENCOUNTER — Inpatient Hospital Stay (HOSPITAL_COMMUNITY)
Admission: AD | Admit: 2016-02-08 | Discharge: 2016-02-08 | Disposition: A | Discharge status: Home / Self Care | Payer: Medicaid Other | Source: Ambulatory Visit | Attending: Family Medicine | Admitting: Family Medicine

## 2016-02-08 ENCOUNTER — Inpatient Hospital Stay (HOSPITAL_COMMUNITY): Payer: Medicaid Other

## 2016-02-08 ENCOUNTER — Encounter (HOSPITAL_COMMUNITY): Payer: Self-pay | Admitting: *Deleted

## 2016-02-08 DIAGNOSIS — R109 Unspecified abdominal pain: Secondary | ICD-10-CM

## 2016-02-08 DIAGNOSIS — O26899 Other specified pregnancy related conditions, unspecified trimester: Secondary | ICD-10-CM

## 2016-02-08 DIAGNOSIS — Z3A10 10 weeks gestation of pregnancy: Secondary | ICD-10-CM | POA: Diagnosis not present

## 2016-02-08 DIAGNOSIS — O021 Missed abortion: Secondary | ICD-10-CM | POA: Insufficient documentation

## 2016-02-08 DIAGNOSIS — O9989 Other specified diseases and conditions complicating pregnancy, childbirth and the puerperium: Secondary | ICD-10-CM

## 2016-02-08 LAB — URINALYSIS, ROUTINE W REFLEX MICROSCOPIC
BILIRUBIN URINE: NEGATIVE
Glucose, UA: NEGATIVE mg/dL
HGB URINE DIPSTICK: NEGATIVE
Ketones, ur: NEGATIVE mg/dL
Leukocytes, UA: NEGATIVE
Nitrite: NEGATIVE
PH: 6 (ref 5.0–8.0)
Protein, ur: NEGATIVE mg/dL
SPECIFIC GRAVITY, URINE: 1.015 (ref 1.005–1.030)

## 2016-02-08 LAB — WET PREP, GENITAL
Clue Cells Wet Prep HPF POC: NONE SEEN
SPERM: NONE SEEN
Trich, Wet Prep: NONE SEEN
YEAST WET PREP: NONE SEEN

## 2016-02-08 NOTE — Discharge Instructions (Signed)
Miscarriage  A miscarriage is the sudden loss of an unborn baby (fetus) before the 20th week of pregnancy. Most miscarriages happen in the first 3 months of pregnancy. Sometimes, it happens before a woman even knows she is pregnant. A miscarriage is also called a "spontaneous miscarriage" or "early pregnancy loss." Having a miscarriage can be an emotional experience. Talk with your caregiver about any questions you may have about miscarrying, the grieving process, and your future pregnancy plans.  CAUSES    Problems with the fetal chromosomes that make it impossible for the baby to develop normally. Problems with the baby's genes or chromosomes are most often the result of errors that occur, by chance, as the embryo divides and grows. The problems are not inherited from the parents.   Infection of the cervix or uterus.    Hormone problems.    Problems with the cervix, such as having an incompetent cervix. This is when the tissue in the cervix is not strong enough to hold the pregnancy.    Problems with the uterus, such as an abnormally shaped uterus, uterine fibroids, or congenital abnormalities.    Certain medical conditions.    Smoking, drinking alcohol, or taking illegal drugs.    Trauma.   Often, the cause of a miscarriage is unknown.   SYMPTOMS    Vaginal bleeding or spotting, with or without cramps or pain.   Pain or cramping in the abdomen or lower back.   Passing fluid, tissue, or blood clots from the vagina.  DIAGNOSIS   Your caregiver will perform a physical exam. You may also have an ultrasound to confirm the miscarriage. Blood or urine tests may also be ordered.  TREATMENT    Sometimes, treatment is not necessary if you naturally pass all the fetal tissue that was in the uterus. If some of the fetus or placenta remains in the body (incomplete miscarriage), tissue left behind may become infected and must be removed. Usually, a dilation and curettage (D and C) procedure is performed.  During a D and C procedure, the cervix is widened (dilated) and any remaining fetal or placental tissue is gently removed from the uterus.   Antibiotic medicines are prescribed if there is an infection. Other medicines may be given to reduce the size of the uterus (contract) if there is a lot of bleeding.   If you have Rh negative blood and your baby was Rh positive, you will need a Rh immunoglobulin shot. This shot will protect any future baby from having Rh blood problems in future pregnancies.  HOME CARE INSTRUCTIONS    Your caregiver may order bed rest or may allow you to continue light activity. Resume activity as directed by your caregiver.   Have someone help with home and family responsibilities during this time.    Keep track of the number of sanitary pads you use each day and how soaked (saturated) they are. Write down this information.    Do not use tampons. Do not douche or have sexual intercourse until approved by your caregiver.    Only take over-the-counter or prescription medicines for pain or discomfort as directed by your caregiver.    Do not take aspirin. Aspirin can cause bleeding.    Keep all follow-up appointments with your caregiver.    If you or your partner have problems with grieving, talk to your caregiver or seek counseling to help cope with the pregnancy loss. Allow enough time to grieve before trying to get pregnant again.     SEEK IMMEDIATE MEDICAL CARE IF:    You have severe cramps or pain in your back or abdomen.   You have a fever.   You pass large blood clots (walnut-sized or larger) ortissue from your vagina. Save any tissue for your caregiver to inspect.    Your bleeding increases.    You have a thick, bad-smelling vaginal discharge.   You become lightheaded, weak, or you faint.    You have chills.   MAKE SURE YOU:   Understand these instructions.   Will watch your condition.   Will get help right away if you are not doing well or get worse.     This  information is not intended to replace advice given to you by your health care provider. Make sure you discuss any questions you have with your health care provider.     Document Released: 11/07/2000 Document Revised: 09/08/2012 Document Reviewed: 07/03/2011  Elsevier Interactive Patient Education 2016 Elsevier Inc.

## 2016-02-08 NOTE — MAU Note (Addendum)
Pt c/o cramping for the last two weeks with a sharp pain occasionally. Pt states the cramping is a lot worse since this morning and she feels vaginal pain. Pt denies bleeding. Pt has appointment tomorrow to begin prenatal care.

## 2016-02-08 NOTE — MAU Provider Note (Signed)
History     CSN: [id number]  Arrival date and time: 02/08/16 1059   First Provider Initiated Contact with Patient 02/08/16 1238      Chief Complaint  Patient presents with  . Abdominal Cramping   G5P0130 @[redacted]w[redacted]d  c/o pelvic cramping x2 weeks. She describes as intermittent lasting a few seconds but when occurred today it lasted 30 minutes constantly. Has not tried Tylenol or heat but elevating LE improves pain. She denies urinary sx. She denies VB, vaginal discharge, and odor. No constipation. No fevers.     OB History    Gravida Para Term Preterm AB Living   5 1 0 1 3 0   SAB TAB Ectopic Multiple Live Births   1 2 0 0        Past Medical History:  Diagnosis Date  . Preterm labor     Past Surgical History:  Procedure Laterality Date  . BREAST LUMPECTOMY Left    fibroid adenoma removed    Family History  Problem Relation Age of Onset  . Diabetes Father     Social History  Substance Use Topics  . Smoking status: Never Smoker  . Smokeless tobacco: Never Used  . Alcohol use No    Allergies: No Known Allergies  Prescriptions Prior to Admission  Medication Sig Dispense Refill Last Dose  . Prenatal Vit-Fe Fumarate-FA (PRENATAL MULTIVITAMIN) TABS tablet Take 1 tablet by mouth daily at 12 noon.   02/07/2016 at Unknown time  . metroNIDAZOLE (FLAGYL) 500 MG tablet Take 1 tablet (500 mg total) by mouth 2 (two) times daily. (Patient not taking: Reported on 02/08/2016) 14 tablet 0 Completed Course at Unknown time  . potassium chloride SA (K-DUR,KLOR-CON) 20 MEQ tablet Take 1 tablet (20 mEq total) by mouth 2 (two) times daily. (Patient not taking: Reported on 02/08/2016) 6 tablet 0 Not Taking at Unknown time  . promethazine (PHENERGAN) 12.5 MG tablet Take 1 tablet (12.5 mg total) by mouth every 6 (six) hours as needed for nausea or vomiting. (Patient not taking: Reported on 02/08/2016) 30 tablet 0 Not Taking at Unknown time  . terconazole (TERAZOL 3) 0.8 % vaginal cream Place 1  applicator vaginally at bedtime. (Patient not taking: Reported on 02/08/2016) 20 g 0 Not Taking at Unknown time    Review of Systems  Constitutional: Negative.   Gastrointestinal: Positive for abdominal pain. Negative for constipation.  Genitourinary: Negative.    Physical Exam   Blood pressure 131/73, pulse 76, temperature 98.6 F (37 C), temperature source Oral, resp. rate 18, height 5\' 5"  (1.651 m), weight 71.2 kg (157 lb), last menstrual period 11/26/2015, SpO2 98 %, unknown if currently breastfeeding.  Physical Exam  Constitutional: She is oriented to person, place, and time. She appears well-developed and well-nourished.  HENT:  Head: Normocephalic and atraumatic.  Neck: Normal range of motion.  Cardiovascular: Normal rate.   Respiratory: Effort normal.  GI: Soft. She exhibits no distension and no mass. There is no tenderness. There is no rebound and no guarding.  Genitourinary:  Genitourinary Comments: External: no lesions Vagina: rugated, parous, thin white discharge Uterus: enlarged, anteverted, no tender, no CMT    Musculoskeletal: Normal range of motion.  Neurological: She is alert and oriented to person, place, and time.  Skin: Skin is warm and dry.  Psychiatric: She has a normal mood and affect.  Bedside US: IUP, subjec. nml AFV, no cardiac activity visualized  Results for orders placed or performed during the hospital encounter of 02/08/16 (from the past  24 hour(s))  Urinalysis, Routine w reflex microscopic (not at [facility])     Status: None   Collection Time: 02/08/16 11:25 AM  Result Value Ref Range   Color, Urine YELLOW YELLOW   APPearance CLEAR CLEAR   Specific Gravity, Urine 1.015 1.005 - 1.030   pH 6.0 5.0 - 8.0   Glucose, UA NEGATIVE NEGATIVE mg/dL   Hgb urine dipstick NEGATIVE NEGATIVE   Bilirubin Urine NEGATIVE NEGATIVE   Ketones, ur NEGATIVE NEGATIVE mg/dL   Protein, ur NEGATIVE NEGATIVE mg/dL   Nitrite NEGATIVE NEGATIVE   Leukocytes, UA NEGATIVE  NEGATIVE  Wet prep, genital     Status: Abnormal   Collection Time: 02/08/16 12:50 PM  Result Value Ref Range   Yeast Wet Prep HPF POC NONE SEEN NONE SEEN   Trich, Wet Prep NONE SEEN NONE SEEN   Clue Cells Wet Prep HPF POC NONE SEEN NONE SEEN   WBC, Wet Prep HPF POC MODERATE (A) NONE SEEN   Sperm NONE SEEN    [place] Ob Comp Less 14 Wks  Result Date: 02/08/2016 CLINICAL DATA:  Pelvic pain for the past 2 weeks which increased today. EXAM: OBSTETRIC <14 WK ULTRASOUND TECHNIQUE: Transabdominal ultrasound was performed for evaluation of the gestation as well as the maternal uterus and adnexal regions. COMPARISON:  Pelvic ultrasound of January 02, 2016 FINDINGS: Intrauterine gestational sac: Present Yolk sac:  Present Embryo:  Present Cardiac Activity: Not visualized Heart Rate: n/a bpm CRL:   25.4  mm   9 w 1 d                  [facility] Subchorionic hemorrhage:  None visualized. Maternal uterus/adnexae: Normal.  No free pelvic fluid. IMPRESSION: No fetal cardiac activity detected today consistent with fetal demise. This confirms the clinically suspected feel demise based on bedside exam. No subchorionic hemorrhage. Normal maternal adnexal structures.  No free pelvic fluid. Electronically Signed   By: David  Swazil[place] M.D.   On: 02/08/2016 13:49    MAU Course  Procedures  MDM Labs and US ordered and reviewed. Confirmation of MAB. Discussed presentation and US findings with Dr. Alvester MorinNewton, recommends D&C. Discussed US findings with patient and partner, was teary and appropriate. Support offered. Message sent to [place].  Assessment and Plan   1. Missed abortion   2. Cramping affecting pregnancy, antepartum   3. Abdominal pain affecting pregnancy    Discharge home SAB precautions Scheduling will call pt with date/time  Donette LarryMelanie Doralene Glanz, CNM 02/08/2016, 1:00 PM

## 2016-02-09 ENCOUNTER — Encounter: Payer: Self-pay | Admitting: Obstetrics and Gynecology

## 2016-02-09 ENCOUNTER — Other Ambulatory Visit: Payer: Self-pay | Admitting: Obstetrics and Gynecology

## 2016-02-09 ENCOUNTER — Encounter (HOSPITAL_COMMUNITY): Payer: Self-pay | Admitting: *Deleted

## 2016-02-09 LAB — GC/CHLAMYDIA PROBE AMP (~~LOC~~) NOT AT ARMC
Chlamydia: NEGATIVE
NEISSERIA GONORRHEA: NEGATIVE

## 2016-02-09 NOTE — H&P (Signed)
Kristin Medina is an 25 y.o. female. [id number] with missed AB at 10 4/7 weeks documented by U/S who presents for D & C. 9 1/7 weeks by U/S. She has been having some cramping and bleeding. Desires definite therapy.   Pertinent Gynecological History: Menses: NA Bleeding: NA Contraception: none DES exposure: denies Blood transfusions: none Sexually transmitted diseases: no past history    Menstrual History: Menarche age: [age] Patient's last menstrual period was 11/26/2015.    Past Medical History:  Diagnosis Date  . Missed ab    x 1 - no surgery required  . Preterm labor   . SVD (spontaneous vaginal delivery)    x 1 - Stillbirth  . Termination of pregnancy (fetus)    x 2    Past Surgical History:  Procedure Laterality Date  . BREAST LUMPECTOMY Left    fibroid adenoma removed    Family History  Problem Relation Age of Onset  . Diabetes Father     Social History:  reports that she has never smoked. She has never used smokeless tobacco. She reports that she uses drugs, including Marijuana. She reports that she does not drink alcohol.  Allergies: No Known Allergies   (Not in a hospital admission)  Review of Systems  Constitutional: Negative.   Respiratory: Negative.   Cardiovascular: Negative.   Gastrointestinal: Negative.     Last menstrual period 11/26/2015, unknown if currently breastfeeding. Physical Exam  Constitutional: She appears well-developed and well-nourished.  Cardiovascular: Normal rate and regular rhythm.   Respiratory: Effort normal and breath sounds normal.  GI: Soft. Bowel sounds are normal.    Results for orders placed or performed during the hospital encounter of 02/08/16 (from the past 24 hour(s))  Wet prep, genital     Status: Abnormal   Collection Time: 02/08/16 12:50 PM  Result Value Ref Range   Yeast Wet Prep HPF POC NONE SEEN NONE SEEN   Trich, Wet Prep NONE SEEN NONE SEEN   Clue Cells Wet Prep HPF POC NONE SEEN NONE SEEN   WBC, Wet  Prep HPF POC MODERATE (A) NONE SEEN   Sperm NONE SEEN     [place] Ob Comp Less 14 Wks  Result Date: 02/08/2016 CLINICAL DATA:  Pelvic pain for the past 2 weeks which increased today. EXAM: OBSTETRIC <14 WK ULTRASOUND TECHNIQUE: Transabdominal ultrasound was performed for evaluation of the gestation as well as the maternal uterus and adnexal regions. COMPARISON:  Pelvic ultrasound of January 02, 2016 FINDINGS: Intrauterine gestational sac: Present Yolk sac:  Present Embryo:  Present Cardiac Activity: Not visualized Heart Rate: n/a bpm CRL:   25.4  mm   9 w 1 d                  [facility] Subchorionic hemorrhage:  None visualized. Maternal uterus/adnexae: Normal.  No free pelvic fluid. IMPRESSION: No fetal cardiac activity detected today consistent with fetal demise. This confirms the clinically suspected feel demise based on bedside exam. No subchorionic hemorrhage. Normal maternal adnexal structures.  No free pelvic fluid. Electronically Signed   By: David  [place] M.D.   On: 02/08/2016 13:49    Assessment/Plan: MAB  For suction D & C. R/B and post op care have been reviewed with pt.   Hermina StaggersMichael L Aarish Rockers 02/09/2016, 12:42 PM

## 2016-02-10 ENCOUNTER — Ambulatory Visit (HOSPITAL_COMMUNITY): Payer: Medicaid Other | Admitting: Anesthesiology

## 2016-02-10 ENCOUNTER — Encounter (HOSPITAL_COMMUNITY): Payer: Self-pay

## 2016-02-10 ENCOUNTER — Ambulatory Visit (HOSPITAL_COMMUNITY)
Admission: AD | Admit: 2016-02-10 | Discharge: 2016-02-10 | Disposition: A | Discharge status: Home / Self Care | Payer: Medicaid Other | Source: Ambulatory Visit | Attending: Obstetrics and Gynecology | Admitting: Obstetrics and Gynecology

## 2016-02-10 ENCOUNTER — Encounter (HOSPITAL_COMMUNITY)
Admission: AD | Disposition: A | Discharge status: Home / Self Care | Payer: Self-pay | Source: Ambulatory Visit | Attending: Obstetrics and Gynecology

## 2016-02-10 DIAGNOSIS — O021 Missed abortion: Secondary | ICD-10-CM | POA: Diagnosis present

## 2016-02-10 DIAGNOSIS — Z3A10 10 weeks gestation of pregnancy: Secondary | ICD-10-CM | POA: Diagnosis not present

## 2016-02-10 HISTORY — PX: DILATION AND CURETTAGE OF UTERUS: SHX78

## 2016-02-10 LAB — CBC
HCT: 34.3 % — ABNORMAL LOW (ref 36.0–46.0)
HEMOGLOBIN: 11.7 g/dL — AB (ref 12.0–15.0)
MCH: 28.1 pg (ref 26.0–34.0)
MCHC: 34.1 g/dL (ref 30.0–36.0)
MCV: 82.5 fL (ref 78.0–100.0)
Platelets: 308 10*3/uL (ref 150–400)
RBC: 4.16 MIL/uL (ref 3.87–5.11)
RDW: 16.3 % — ABNORMAL HIGH (ref 11.5–15.5)
WBC: 7.4 10*3/uL (ref 4.0–10.5)

## 2016-02-10 SURGERY — DILATION AND CURETTAGE
Anesthesia: General | Site: Vagina

## 2016-02-10 MED ORDER — MISOPROSTOL 200 MCG PO TABS
200.0000 ug | ORAL_TABLET | Freq: Once | ORAL | Status: DC
  Filled 2016-02-10: qty 1

## 2016-02-10 MED ORDER — MIDAZOLAM HCL 5 MG/5ML IJ SOLN
INTRAMUSCULAR | Status: DC | PRN
  Administered 2016-02-10: 2 mg via INTRAVENOUS

## 2016-02-10 MED ORDER — DEXAMETHASONE SODIUM PHOSPHATE 4 MG/ML IJ SOLN
INTRAMUSCULAR | Status: DC | PRN
  Administered 2016-02-10: 10 mg via INTRAVENOUS

## 2016-02-10 MED ORDER — ACETAMINOPHEN 325 MG PO TABS: 325.0000 mg | ORAL_TABLET | ORAL | Status: DC | PRN

## 2016-02-10 MED ORDER — ACETAMINOPHEN 160 MG/5ML PO SOLN: 325.0000 mg | ORAL | Status: DC | PRN

## 2016-02-10 MED ORDER — DEXTROSE-NACL 5-0.9 % IV SOLN: INTRAVENOUS | Status: DC

## 2016-02-10 MED ORDER — SCOPOLAMINE 1 MG/3DAYS TD PT72
MEDICATED_PATCH | TRANSDERMAL | Status: AC
  Filled 2016-02-10: qty 1

## 2016-02-10 MED ORDER — ONDANSETRON HCL 4 MG/2ML IJ SOLN: 4.0000 mg | Freq: Once | INTRAMUSCULAR | Status: DC | PRN

## 2016-02-10 MED ORDER — MIDAZOLAM HCL 2 MG/2ML IJ SOLN
INTRAMUSCULAR | Status: AC
  Filled 2016-02-10: qty 2

## 2016-02-10 MED ORDER — DEXAMETHASONE SODIUM PHOSPHATE 10 MG/ML IJ SOLN
INTRAMUSCULAR | Status: AC
  Filled 2016-02-10: qty 1

## 2016-02-10 MED ORDER — MEPERIDINE HCL 25 MG/ML IJ SOLN: 6.2500 mg | INTRAMUSCULAR | Status: DC | PRN

## 2016-02-10 MED ORDER — RHO D IMMUNE GLOBULIN 1500 UNITS IM SOSY: 1500.0000 [IU] | PREFILLED_SYRINGE | Freq: Once | INTRAMUSCULAR | Status: DC

## 2016-02-10 MED ORDER — FENTANYL CITRATE (PF) 100 MCG/2ML IJ SOLN
INTRAMUSCULAR | Status: AC
  Filled 2016-02-10: qty 2

## 2016-02-10 MED ORDER — LACTATED RINGERS IV SOLN
INTRAVENOUS | Status: DC
  Administered 2016-02-10: 125 mL/h via INTRAVENOUS
  Administered 2016-02-10: 14:00:00 via INTRAVENOUS

## 2016-02-10 MED ORDER — BUPIVACAINE HCL (PF) 0.25 % IJ SOLN
INTRAMUSCULAR | Status: AC
  Filled 2016-02-10: qty 30

## 2016-02-10 MED ORDER — ONDANSETRON HCL 4 MG/2ML IJ SOLN
INTRAMUSCULAR | Status: DC | PRN
  Administered 2016-02-10: 4 mg via INTRAVENOUS

## 2016-02-10 MED ORDER — SCOPOLAMINE 1 MG/3DAYS TD PT72
1.0000 | MEDICATED_PATCH | Freq: Once | TRANSDERMAL | Status: DC
  Administered 2016-02-10: 1.5 mg via TRANSDERMAL

## 2016-02-10 MED ORDER — KETOROLAC TROMETHAMINE 30 MG/ML IJ SOLN
INTRAMUSCULAR | Status: DC | PRN
  Administered 2016-02-10: 30 mg via INTRAVENOUS

## 2016-02-10 MED ORDER — PROPOFOL 10 MG/ML IV BOLUS
INTRAVENOUS | Status: DC | PRN
  Administered 2016-02-10: 200 mg via INTRAVENOUS

## 2016-02-10 MED ORDER — LIDOCAINE HCL (CARDIAC) 20 MG/ML IV SOLN
INTRAVENOUS | Status: DC | PRN
  Administered 2016-02-10: 100 mg via INTRAVENOUS

## 2016-02-10 MED ORDER — KETOROLAC TROMETHAMINE 30 MG/ML IJ SOLN
INTRAMUSCULAR | Status: AC
  Filled 2016-02-10: qty 1

## 2016-02-10 MED ORDER — LIDOCAINE HCL (CARDIAC) 20 MG/ML IV SOLN
INTRAVENOUS | Status: AC
  Filled 2016-02-10: qty 5

## 2016-02-10 MED ORDER — PROPOFOL 10 MG/ML IV BOLUS
INTRAVENOUS | Status: AC
  Filled 2016-02-10: qty 20

## 2016-02-10 MED ORDER — MISOPROSTOL 100 MCG PO TABS
ORAL_TABLET | ORAL | Status: DC | PRN
  Administered 2016-02-10: 200 ug via RECTAL

## 2016-02-10 MED ORDER — OXYCODONE HCL 5 MG/5ML PO SOLN: 5.0000 mg | Freq: Once | ORAL | Status: DC | PRN

## 2016-02-10 MED ORDER — MISOPROSTOL 200 MCG PO TABS
ORAL_TABLET | ORAL | Status: AC
  Filled 2016-02-10: qty 1

## 2016-02-10 MED ORDER — IBUPROFEN 800 MG PO TABS: 800.0000 mg | ORAL_TABLET | Freq: Three times a day (TID) | ORAL | 0 refills | Status: DC | PRN

## 2016-02-10 MED ORDER — OXYCODONE HCL 5 MG PO TABS: 5.0000 mg | ORAL_TABLET | Freq: Once | ORAL | Status: DC | PRN

## 2016-02-10 MED ORDER — ONDANSETRON HCL 4 MG/2ML IJ SOLN
INTRAMUSCULAR | Status: AC
  Filled 2016-02-10: qty 2

## 2016-02-10 MED ORDER — RHO D IMMUNE GLOBULIN 1500 UNIT/2ML IJ SOSY
300.0000 ug | PREFILLED_SYRINGE | Freq: Once | INTRAMUSCULAR | Status: AC
  Administered 2016-02-10: 300 ug via INTRAMUSCULAR
  Filled 2016-02-10: qty 2

## 2016-02-10 MED ORDER — KETOROLAC TROMETHAMINE 30 MG/ML IJ SOLN: 30.0000 mg | Freq: Once | INTRAMUSCULAR | Status: DC

## 2016-02-10 MED ORDER — BUPIVACAINE HCL 0.25 % IJ SOLN
INTRAMUSCULAR | Status: DC | PRN
  Administered 2016-02-10: 6 mL

## 2016-02-10 MED ORDER — FENTANYL CITRATE (PF) 100 MCG/2ML IJ SOLN
INTRAMUSCULAR | Status: DC | PRN
  Administered 2016-02-10: 50 ug via INTRAVENOUS

## 2016-02-10 MED ORDER — OXYCODONE HCL 5 MG PO TABS: 5.0000 mg | ORAL_TABLET | Freq: Four times a day (QID) | ORAL | 0 refills | Status: DC | PRN

## 2016-02-10 MED ORDER — FENTANYL CITRATE (PF) 100 MCG/2ML IJ SOLN: 25.0000 ug | INTRAMUSCULAR | Status: DC | PRN

## 2016-02-10 SURGICAL SUPPLY — 17 items
CANISTER SUCT 3000ML (MISCELLANEOUS) ×2 IMPLANT
CATH ROBINSON RED A/P 16FR (CATHETERS) ×2 IMPLANT
CLOTH BEACON ORANGE TIMEOUT ST (SAFETY) ×2 IMPLANT
CONTAINER PREFILL 10% NBF 60ML (FORM) ×4 IMPLANT
GLOVE BIO SURGEON STRL SZ7.5 (GLOVE) ×2 IMPLANT
GLOVE BIOGEL PI IND STRL 7.0 (GLOVE) ×1 IMPLANT
GLOVE BIOGEL PI INDICATOR 7.0 (GLOVE) ×1
GOWN STRL REUS W/TWL LRG LVL3 (GOWN DISPOSABLE) ×2 IMPLANT
GOWN STRL REUS W/TWL XL LVL3 (GOWN DISPOSABLE) ×2 IMPLANT
KIT BERKELEY 1ST TRIMESTER 3/8 (MISCELLANEOUS) ×1 IMPLANT
PACK VAGINAL MINOR WOMEN LF (CUSTOM PROCEDURE TRAY) ×2 IMPLANT
PAD OB MATERNITY 4.3X12.25 (PERSONAL CARE ITEMS) ×2 IMPLANT
PAD PREP 24X48 CUFFED NSTRL (MISCELLANEOUS) ×2 IMPLANT
SET BERKELEY SUCTION TUBING (SUCTIONS) ×1 IMPLANT
TOWEL OR 17X24 6PK STRL BLUE (TOWEL DISPOSABLE) ×4 IMPLANT
VACURETTE 8 RIGID CVD (CANNULA) ×1 IMPLANT
WATER STERILE IRR 1000ML POUR (IV SOLUTION) ×2 IMPLANT

## 2016-02-10 NOTE — Anesthesia Procedure Notes (Signed)
Procedure Name: LMA Insertion Date/Time: 02/10/2016 1:06 PM Performed by: Junious SilkGILBERT, Kolsen Choe Pre-anesthesia Checklist: Patient identified, Emergency Drugs available, Suction available, Patient being monitored and Timeout performed Patient Re-evaluated:Patient Re-evaluated prior to inductionOxygen Delivery Method: Circle system utilized Preoxygenation: Pre-oxygenation with 100% oxygen Intubation Type: IV induction LMA: LMA inserted LMA Size: 4.0 Number of attempts: 1 Placement Confirmation: positive ETCO2,  CO2 detector and breath sounds checked- equal and bilateral Tube secured with: Tape Dental Injury: Teeth and Oropharynx as per pre-operative assessment

## 2016-02-10 NOTE — Op Note (Signed)
Jo C Winslow PROCEDURE DATE: 02/10/2016  PREOPERATIVE DIAGNOSIS: 10 week missed abortion POSTOPERATIVE DIAGNOSIS: The same PROCEDURE:     Dilation and Evacuation SURGEON:  Dr. Casimiro NeedleMichael L. Teisha Trowbridge  INDICATIONS: 25 y.o. [id number] with MAB at 10 1/[redacted] weeks gestation, needing surgical completion.  Risks of surgery were discussed with the patient including but not limited to: bleeding which may require transfusion; infection which may require antibiotics; injury to uterus or surrounding organs; need for additional procedures including laparotomy or laparoscopy; possibility of intrauterine scarring which may impair future fertility; and other postoperative/anesthesia complications. Written informed consent was obtained.    FINDINGS:  A 10 week size uterus, moderate amounts of products of conception, specimen sent to pathology.  ANESTHESIA:    Monitored intravenous sedation, paracervical block. INTRAVENOUS FLUIDS:  As per anesthesia records ESTIMATED BLOOD LOSS:  25 cc SPECIMENS:  Products of conception sent to pathology COMPLICATIONS:  None immediate.  PROCEDURE DETAILS:  The patient was t taken to the operating room where monitored intravenous sedation was administered and was found to be adequate.  After an adequate timeout was performed, she was placed in the dorsal lithotomy position and examined; then prepped and draped in the sterile manner.   Her bladder was catheterized for an unmeasured amount of clear, yellow urine. A vaginal speculum was then placed in the patient's vagina and a single tooth tenaculum was applied to the anterior lip of the cervix.  A paracervical block using 6 ml of 0.25% Marcaine was administered. The cervix was gently dilated to accommodate a 8 mm suction curette that was gently advanced to the uterine fundus.  The suction device was then activated and curette slowly rotated to clear the uterus of products of conception.  A sharp curettage was then performed to confirm complete  emptying of the uterus. Bimanual exam found the uterus to be firm.There was minimal bleeding noted and the tenaculum removed with good hemostasis noted.   All instruments were removed from the patient's vagina. 200 mcg of Cytotec was placed rectal at conclusion of case. Sponge and instrument counts were correct times two  The patient tolerated the procedure well and was taken to the recovery area awake, and in stable condition.  The patient will be discharged to home as per PACU criteria.  Routine postoperative instructions given.  She was prescribed Percocet, Ibuprofen and Colace.  She will follow up in the clinic in 4 weeks for postoperative evaluation.   Olden Klauer L. Alysia PennaErvin, MD, FACOG Attending Obstetrician & Gynecologist Faculty Practice, [facility] [place]

## 2016-02-10 NOTE — Discharge Instructions (Signed)
Dilation and Curettage or Vacuum Curettage, Care After Refer to this sheet in the next few weeks. These instructions provide you with information on caring for yourself after your procedure. Your health care provider may also give you more specific instructions. Your treatment has been planned according to current medical practices, but problems sometimes occur. Call your health care provider if you have any problems or questions after your procedure. WHAT TO EXPECT AFTER THE PROCEDURE After your procedure, it is typical to have light cramping and bleeding. This may last for 2 days to 2 weeks after the procedure. HOME CARE INSTRUCTIONS   Do not drive for 24 hours.  Wait 1 week before returning to strenuous activities.  Take your temperature 2 times a day for 4 days and write it down. Provide these temperatures to your health care provider if you develop a fever.  Avoid long periods of standing.  Avoid heavy lifting, pushing, or pulling. Do not lift anything heavier than 10 pounds (4.5 kg).  Limit stair climbing to once or twice a day.  Take rest periods often.  You may resume your usual diet.  Drink enough fluids to keep your urine clear or pale yellow.  Your usual bowel function should return. If you have constipation, you may:  Take a mild laxative with permission from your health care provider.  Add fruit and bran to your diet.  Drink more fluids.  Take showers instead of baths until your health care provider gives you permission to take baths.  Do not go swimming or use a hot tub until your health care provider approves.  Try to have someone with you or available to you the first 24-48 hours, especially if you were given a general anesthetic.  Do not douche, use tampons, or have sex (intercourse) for 2 weeks after the procedure.  Only take over-the-counter or prescription medicines as directed by your health care provider. Do not take aspirin. It can cause  bleeding.  Follow up with your health care provider as directed. SEEK MEDICAL CARE IF:   You have increasing cramps or pain that is not relieved with medicine.  You have abdominal pain that does not seem to be related to the same area of earlier cramping and pain.  You have bad smelling vaginal discharge.  You have a rash.  You are having problems with any medicine. SEEK IMMEDIATE MEDICAL CARE IF:   You have bleeding that is heavier than a normal menstrual period.  You have a fever.  You have chest pain.  You have shortness of breath.  You feel dizzy or feel like fainting.  You pass out.  You have pain in your shoulder strap area.  You have heavy vaginal bleeding with or without blood clots. MAKE SURE YOU:   Understand these instructions.  Will watch your condition.  Will get help right away if you are not doing well or get worse.   This information is not intended to replace advice given to you by your health care provider. Make sure you discuss any questions you have with your health care provider.   Document Released: 05/11/2000 Document Revised: 05/19/2013 Document Reviewed: 12/11/2012 Elsevier Interactive Patient Education 2016 Elsevier Inc.  

## 2016-02-10 NOTE — Anesthesia Preprocedure Evaluation (Signed)
Anesthesia Evaluation  Patient identified by MRN, date of birth, ID band Patient awake    Reviewed: Allergy & Precautions, H&P , NPO status , Patient's Chart, lab work & pertinent test results  Airway Mallampati: I  TM Distance: >3 FB Neck ROM: full    Dental no notable dental hx.    Pulmonary neg pulmonary ROS,    Pulmonary exam normal        Cardiovascular negative cardio ROS Normal cardiovascular exam     Neuro/Psych negative neurological ROS  negative psych ROS   GI/Hepatic negative GI ROS, Neg liver ROS,   Endo/Other  negative endocrine ROS  Renal/GU negative Renal ROS     Musculoskeletal   Abdominal Normal abdominal exam  (+)   Peds  Hematology negative hematology ROS (+)   Anesthesia Other Findings   Reproductive/Obstetrics (+) Pregnancy                             Anesthesia Physical Anesthesia Plan  ASA: II  Anesthesia Plan: General   Post-op Pain Management:    Induction: Intravenous  Airway Management Planned: LMA  Additional Equipment:   Intra-op Plan:   Post-operative Plan:   Informed Consent: I have reviewed the patients History and Physical, chart, labs and discussed the procedure including the risks, benefits and alternatives for the proposed anesthesia with the patient or authorized representative who has indicated his/her understanding and acceptance.     Plan Discussed with: CRNA and Surgeon  Anesthesia Plan Comments:         Anesthesia Quick Evaluation

## 2016-02-10 NOTE — Anesthesia Postprocedure Evaluation (Signed)
Anesthesia Post Note  Patient: Kristin Medina  Procedure(s) Performed: Procedure(s) (LRB): SUCTION DILATATION AND CURETTAGE (N/A)  Patient location during evaluation: PACU Anesthesia Type: General Level of consciousness: awake Pain management: pain level controlled Vital Signs Assessment: post-procedure vital signs reviewed and stable Respiratory status: spontaneous breathing Cardiovascular status: stable Postop Assessment: no signs of nausea or vomiting Anesthetic complications: no     Last Vitals:  Vitals:   02/10/16 1341 02/10/16 1345  BP: 103/72 100/67  Pulse: (!) 57 63  Resp: 16 15  Temp: 36.7 C     Last Pain:  Vitals:   02/10/16 1213  TempSrc: Oral   Pain Goal: Patients Stated Pain Goal: 3 (02/10/16 1213)               Kristin Medina,Kristin Medina

## 2016-02-10 NOTE — Transfer of Care (Signed)
Immediate Anesthesia Transfer of Care Note  Patient: [name]  Procedure(s) Performed: Procedure(s): SUCTION DILATATION AND CURETTAGE (N/A)  Patient Location: PACU  Anesthesia Type:General  Level of Consciousness: awake, alert  and oriented  Airway & Oxygen Therapy: Patient Spontanous Breathing and Patient connected to nasal cannula oxygen  Post-op Assessment: Report given to RN and Post -op Vital signs reviewed and stable  Post vital signs: Reviewed and stable  Last Vitals:  Vitals:   02/10/16 1213  BP: 131/83  Pulse: 73  Resp: 18  Temp: 36.7 C    Last Pain:  Vitals:   02/10/16 1213  TempSrc: Oral      Patients Stated Pain Goal: 3 (02/10/16 1213)  Complications: No apparent anesthesia complications

## 2016-02-10 NOTE — Interval H&P Note (Signed)
History and Physical Interval Note:  02/10/2016 12:35 PM  Kristin Medina  has presented today for surgery, with the diagnosis of cpt 59820 - 10 week Missed AB  The various methods of treatment have been discussed with the patient and family. After consideration of risks, benefits and other options for treatment, the patient has consented to  Procedure(s): DILATATION AND CURETTAGE (N/A) as a surgical intervention .  The patient's history has been reviewed, patient examined, no change in status, stable for surgery.  I have reviewed the patient's chart and labs.  Questions were answered to the patient's satisfaction.     Hermina StaggersMichael L Keiara Sneeringer

## 2016-02-10 NOTE — H&P (View-Only) (Signed)
Kristin Medina is an 25 y.o. female. G5P0130 with missed AB at 10 4/7 weeks documented by U/S who presents for D & C. 9 1/7 weeks by U/S. She has been having some cramping and bleeding. Desires definite therapy.   Pertinent Gynecological History: Menses: NA Bleeding: NA Contraception: none DES exposure: denies Blood transfusions: none Sexually transmitted diseases: no past history    Menstrual History: Menarche age: 10 Patient's last menstrual period was 11/26/2015.    Past Medical History:  Diagnosis Date  . Missed ab    x 1 - no surgery required  . Preterm labor   . SVD (spontaneous vaginal delivery)    x 1 - Stillbirth  . Termination of pregnancy (fetus)    x 2    Past Surgical History:  Procedure Laterality Date  . BREAST LUMPECTOMY Left    fibroid adenoma removed    Family History  Problem Relation Age of Onset  . Diabetes Father     Social History:  reports that she has never smoked. She has never used smokeless tobacco. She reports that she uses drugs, including Marijuana. She reports that she does not drink alcohol.  Allergies: No Known Allergies   (Not in a hospital admission)  Review of Systems  Constitutional: Negative.   Respiratory: Negative.   Cardiovascular: Negative.   Gastrointestinal: Negative.     Last menstrual period 11/26/2015, unknown if currently breastfeeding. Physical Exam  Constitutional: She appears well-developed and well-nourished.  Cardiovascular: Normal rate and regular rhythm.   Respiratory: Effort normal and breath sounds normal.  GI: Soft. Bowel sounds are normal.    Results for orders placed or performed during the hospital encounter of 02/08/16 (from the past 24 hour(s))  Wet prep, genital     Status: Abnormal   Collection Time: 02/08/16 12:50 PM  Result Value Ref Range   Yeast Wet Prep HPF POC NONE SEEN NONE SEEN   Trich, Wet Prep NONE SEEN NONE SEEN   Clue Cells Wet Prep HPF POC NONE SEEN NONE SEEN   WBC, Wet  Prep HPF POC MODERATE (A) NONE SEEN   Sperm NONE SEEN     Us Ob Comp Less 14 Wks  Result Date: 02/08/2016 CLINICAL DATA:  Pelvic pain for the past 2 weeks which increased today. EXAM: OBSTETRIC <14 WK ULTRASOUND TECHNIQUE: Transabdominal ultrasound was performed for evaluation of the gestation as well as the maternal uterus and adnexal regions. COMPARISON:  Pelvic ultrasound of January 02, 2016 FINDINGS: Intrauterine gestational sac: Present Yolk sac:  Present Embryo:  Present Cardiac Activity: Not visualized Heart Rate: n/a bpm CRL:   25.4  mm   9 w 1 d                  US EDC: September 11, 2016 Subchorionic hemorrhage:  None visualized. Maternal uterus/adnexae: Normal.  No free pelvic fluid. IMPRESSION: No fetal cardiac activity detected today consistent with fetal demise. This confirms the clinically suspected feel demise based on bedside exam. No subchorionic hemorrhage. Normal maternal adnexal structures.  No free pelvic fluid. Electronically Signed   By: David  Jordan M.D.   On: 02/08/2016 13:49    Assessment/Plan: MAB  For suction D & C. R/B and post op care have been reviewed with pt.   Jalecia Leon L Ranny Wiebelhaus 02/09/2016, 12:42 PM 

## 2016-02-11 LAB — RH IG WORKUP (INCLUDES ABO/RH)
ABO/RH(D): A NEG
Antibody Screen: NEGATIVE
Gestational Age(Wks): 10
UNIT DIVISION: 0

## 2016-02-12 ENCOUNTER — Encounter (HOSPITAL_COMMUNITY): Payer: Self-pay | Admitting: Obstetrics and Gynecology

## 2016-02-14 ENCOUNTER — Encounter: Payer: Self-pay | Admitting: Obstetrics and Gynecology

## 2016-05-06 ENCOUNTER — Emergency Department (HOSPITAL_COMMUNITY)
Admission: EM | Admit: 2016-05-06 | Discharge: 2016-05-06 | Disposition: A | Discharge status: Home / Self Care | Payer: Medicaid Other | Attending: Emergency Medicine | Admitting: Emergency Medicine

## 2016-05-06 ENCOUNTER — Encounter (HOSPITAL_COMMUNITY): Payer: Self-pay | Admitting: *Deleted

## 2016-05-06 DIAGNOSIS — K0381 Cracked tooth: Secondary | ICD-10-CM | POA: Insufficient documentation

## 2016-05-06 DIAGNOSIS — S025XXA Fracture of tooth (traumatic), initial encounter for closed fracture: Secondary | ICD-10-CM

## 2016-05-06 DIAGNOSIS — K0889 Other specified disorders of teeth and supporting structures: Secondary | ICD-10-CM

## 2016-05-06 DIAGNOSIS — Z79899 Other long term (current) drug therapy: Secondary | ICD-10-CM | POA: Insufficient documentation

## 2016-05-06 MED ORDER — PENICILLIN V POTASSIUM 500 MG PO TABS: 500.0000 mg | ORAL_TABLET | Freq: Four times a day (QID) | ORAL | 0 refills | Status: DC

## 2016-05-06 MED ORDER — TRAMADOL HCL 50 MG PO TABS: 50.0000 mg | ORAL_TABLET | Freq: Four times a day (QID) | ORAL | 0 refills | Status: DC | PRN

## 2016-05-06 NOTE — ED Triage Notes (Signed)
Pt reports having right upper dental pain for several days. Hx of broken tooth. Airway intact.

## 2016-05-06 NOTE — ED Provider Notes (Signed)
[facility]-EMERGENCY DEPT Provider Note   CSN: [id number] Arrival date & time: 05/06/16  1403  By signing my name below, I, Kristin Medina, attest that this documentation has been prepared under the direction and in the presence of Kristin Ridgehris Dilynn Munroe, PA-C Electronically Signed: Soijett Medina, ED Scribe. 05/06/16. 2:25 PM.  History   Chief Complaint Chief Complaint  Patient presents with  . Dental Pain    HPI Kristin Medina is a 25 y.o. female who presents to the Emergency Department complaining of right upper dental pain onset 2 years worsening today. Pt states that the affected tooth has been chipped for several years with a hole to the affected tooth following eating. Pt denies having a dentist at this time. She states that she has not tried any medications for the relief of her symptoms. She denies fever, chills, drainage, trouble swallowing, and any other symptoms.    The history is provided by the patient. No language interpreter was used.    Past Medical History:  Diagnosis Date  . Missed ab    x 1 - no surgery required  . Preterm labor   . SVD (spontaneous vaginal delivery)    x 1 - Stillbirth  . Termination of pregnancy (fetus)    x 2    Patient Active Problem List   Diagnosis Date Noted  . Pregnancy 05/17/2013    Past Surgical History:  Procedure Laterality Date  . BREAST LUMPECTOMY Left    fibroid adenoma removed  . DILATION AND CURETTAGE OF UTERUS N/A 02/10/2016   Procedure: SUCTION DILATATION AND CURETTAGE;  Surgeon: Hermina StaggersMichael L Ervin, MD;  Location: [facility];  Service: Gynecology;  Laterality: N/A;    OB History    Gravida Para Term Preterm AB Living   5 1 0 1 3 0   SAB TAB Ectopic Multiple Live Births   1 2 0 0         Home Medications    Prior to Admission medications   Medication Sig Start Date End Date Taking? Authorizing Provider  ibuprofen (ADVIL,MOTRIN) 800 MG tablet Take 1 tablet (800 mg total) by mouth every 8 (eight) hours as needed for moderate pain.  02/10/16   Hermina StaggersMichael L Ervin, MD  oxyCODONE (OXY IR/ROXICODONE) 5 MG immediate release tablet Take 1-2 tablets (5-10 mg total) by mouth every 6 (six) hours as needed for severe pain. 02/10/16   Hermina StaggersMichael L Ervin, MD  Prenatal Vit-Fe Fumarate-FA (PRENATAL MULTIVITAMIN) TABS tablet Take 1 tablet by mouth daily at 12 noon.     Historical Provider, MD    Family History Family History  Problem Relation Age of Onset  . Diabetes Father     Social History Social History  Substance Use Topics  . Smoking status: Never Smoker  . Smokeless tobacco: Never Used  . Alcohol use No     Allergies   Patient has no known allergies.   Review of Systems Review of Systems  Constitutional: Negative for chills and fever.  HENT: Positive for dental problem (right upper). Negative for trouble swallowing.      Physical Exam Updated Vital Signs BP 134/92 (BP Location: Right Arm)   Pulse 95   Temp 98.3 F (36.8 C) (Oral)   Resp 18   LMP 03/27/2016   SpO2 100%   Breastfeeding? Unknown   Physical Exam  Constitutional: She is oriented to person, place, and time. She appears well-developed and well-nourished. No distress.  HENT:  Head: Normocephalic and atraumatic.  Mouth/Throat: Uvula is midline,  oropharynx is clear and moist and mucous membranes are normal. No trismus in the jaw. Abnormal dentition. Dental caries present. No dental abscesses.  1st right upper molar with area of decay and cavity. No gum swelling.   Eyes: Pupils are equal, round, and reactive to light.  Pulmonary/Chest: Effort normal.  Neurological: She is alert and oriented to person, place, and time.  Skin: Skin is warm and dry.  Psychiatric: She has a normal mood and affect.  Nursing note and vitals reviewed.    ED Treatments / Results  DIAGNOSTIC STUDIES: Oxygen Saturation is 100% on RA, nl by my interpretation.    COORDINATION OF CARE: 2:23 PM Discussed treatment plan with pt at bedside which includes referral and follow  up with dentist and pt agreed to plan.   Procedures Procedures (including critical care time)  Medications Ordered in ED Medications - No data to display   Initial Impression / Assessment and Plan / ED Course  I have reviewed the triage vital signs and the nursing notes.  Clinical Course       Final Clinical Impressions(s) / ED Diagnoses   Final diagnoses:  None    New Prescriptions New Prescriptions   No medications on file   I personally performed the services described in this documentation, which was scribed in my presence. The recorded information has been reviewed and is accurate.    Charlestine NightChristopher Anival Pasha, PA-C 05/06/16 1431    Nelva Nayobert Beaton, MD 05/09/16 1125

## 2016-05-06 NOTE — Discharge Instructions (Signed)
Return here as needed. Follow up with a dentist.  °

## 2016-05-06 NOTE — ED Notes (Signed)
Declined W/C at D/C and was escorted to lobby by RN. 

## 2016-10-26 IMAGING — US US OB COMP LESS 14 WK
1 series · 15 of 28 positions shown · non-contrast
Comparison: Pelvic ultrasound January 02, 2016

CLINICAL DATA: Pelvic pain for the past 2 weeks which increased
today.

EXAM:
OBSTETRIC <14 WK ULTRASOUND
TECHNIQUE: Transabdominal ultrasound was performed for evaluation of the
gestation as well as the maternal uterus and adnexal regions.

[Series 1: us ob comp less 14 wk · 35 acquisitions, 15 frames shown]
[im 1/35]
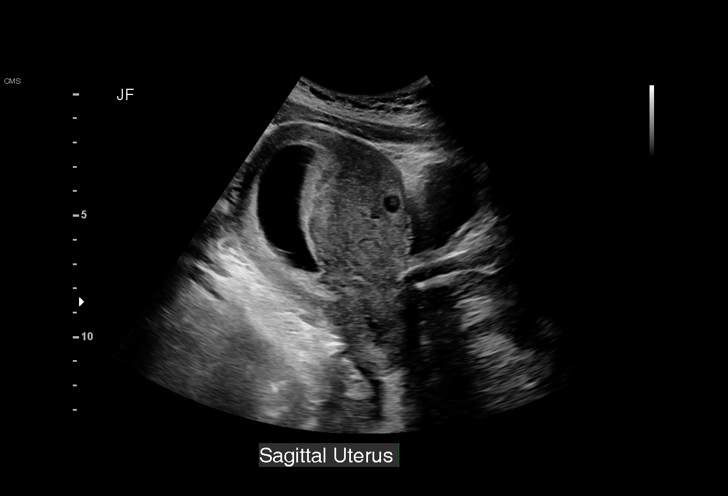
[im 3/35]
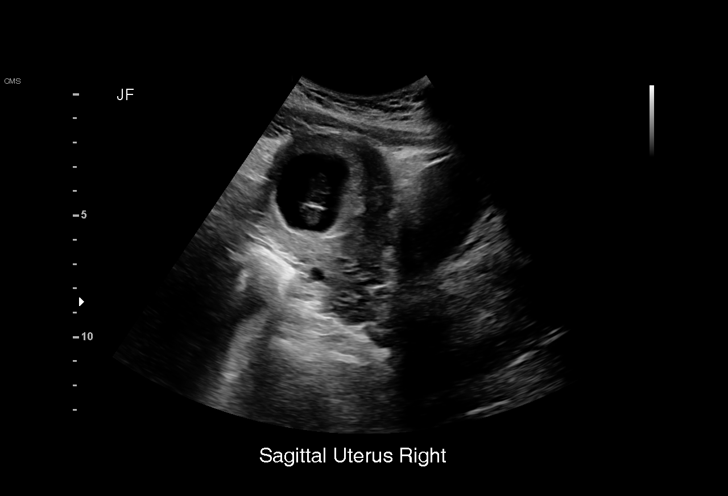
[im 6/35]
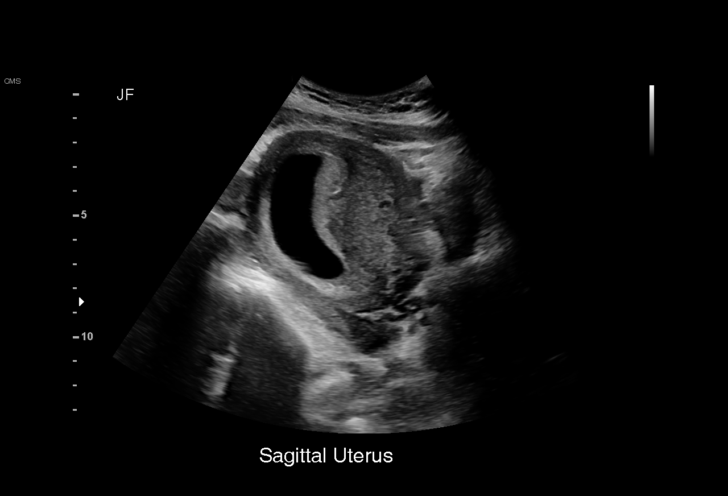
[im 8/35]
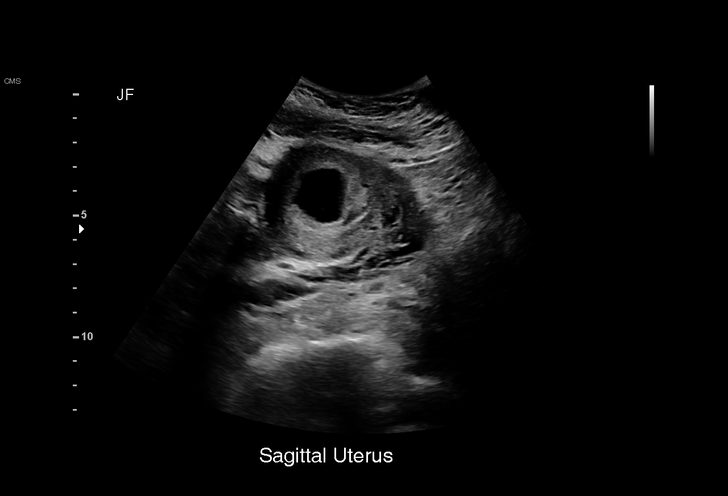
[im 11/35]
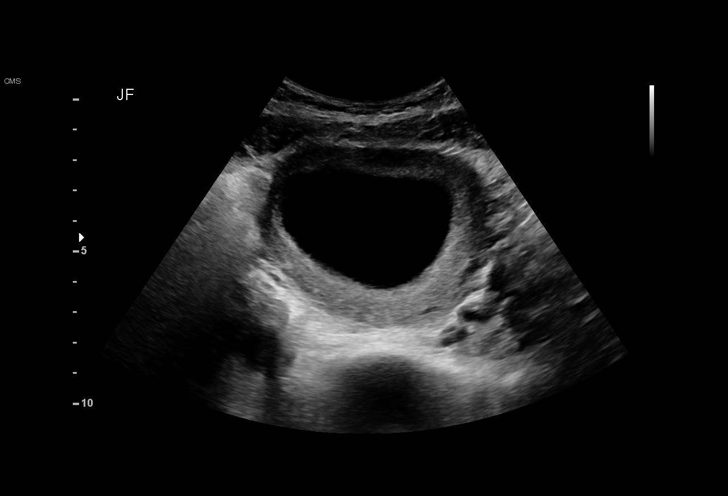
[im 13/35]
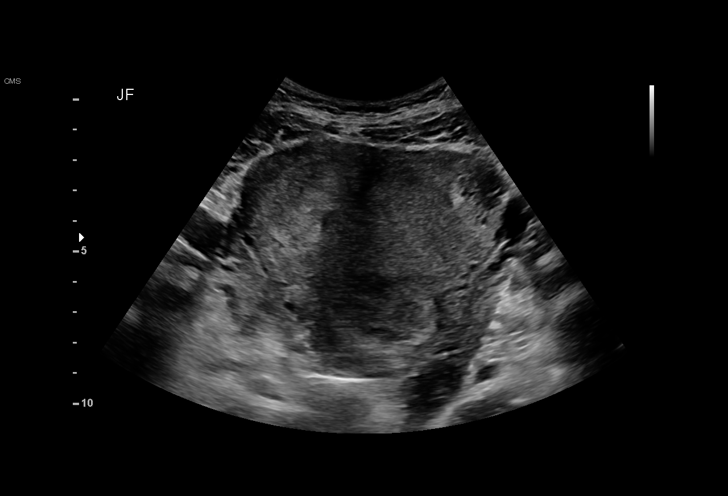
[im 16/35]
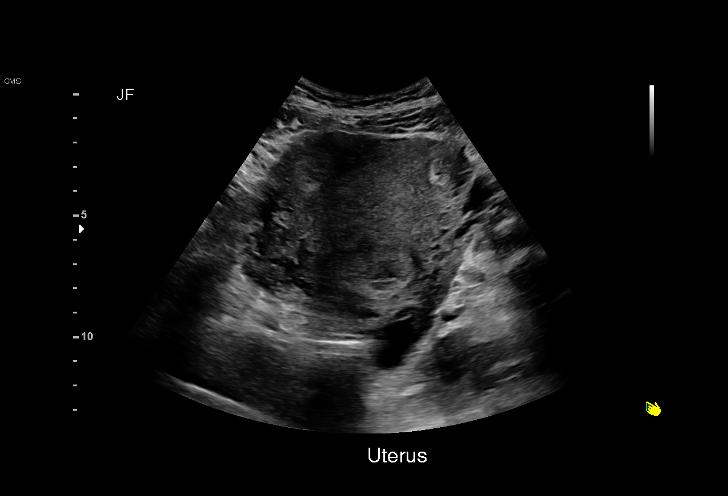
[im 18/35]
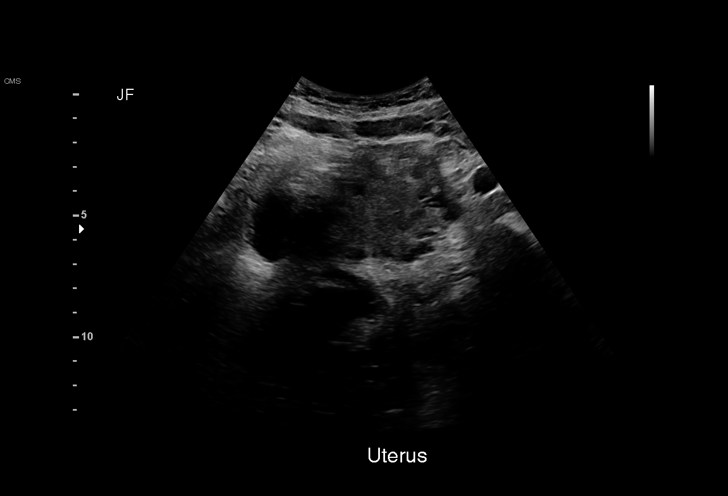
[im 19/35]
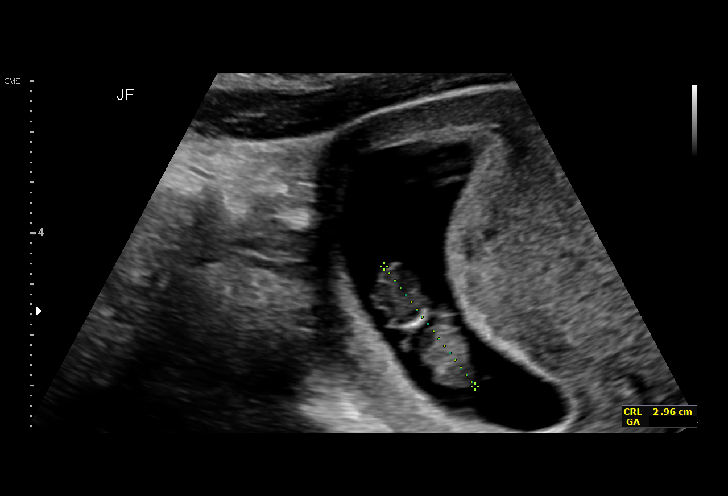
[im 22/35]
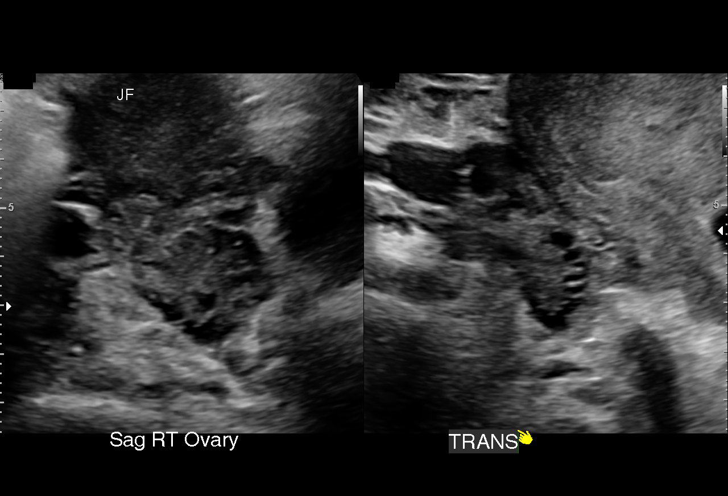
[im 24/35]
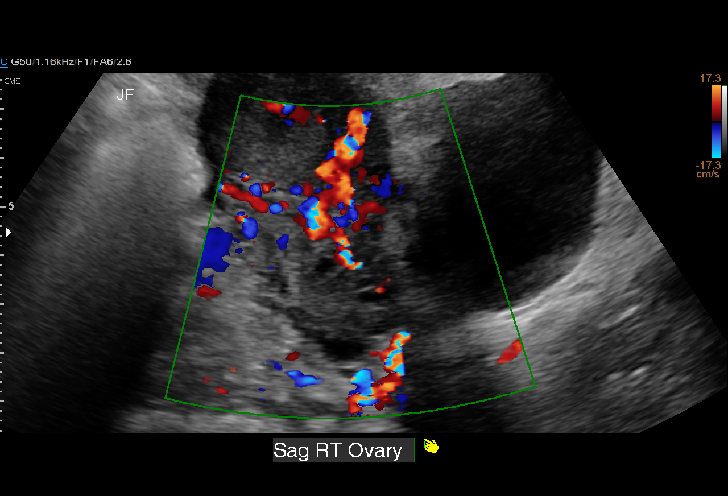
[im 27/35]
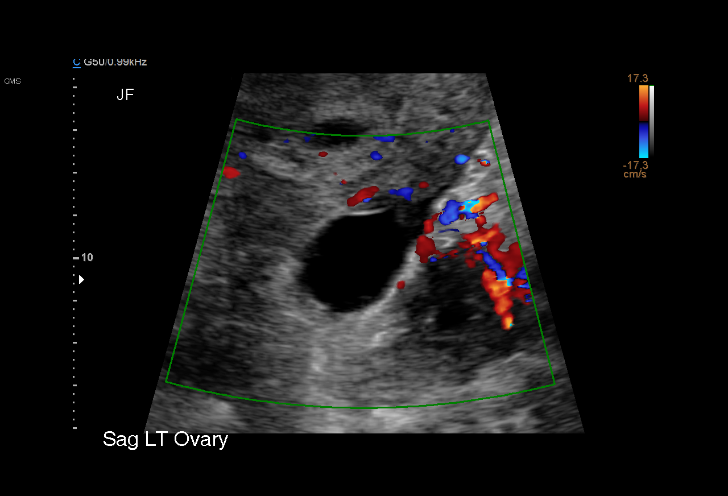
[im 29/35]
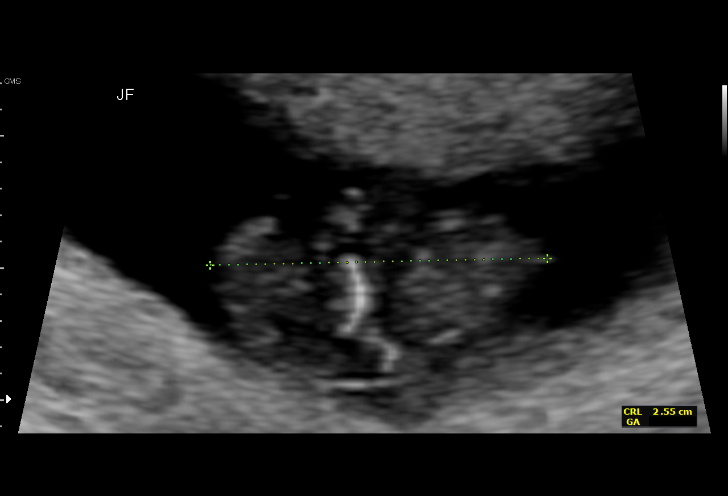
[im 32/35]
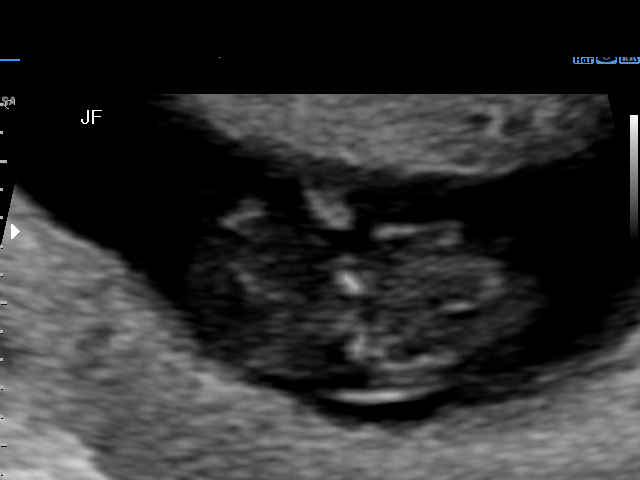
[im 35/35]
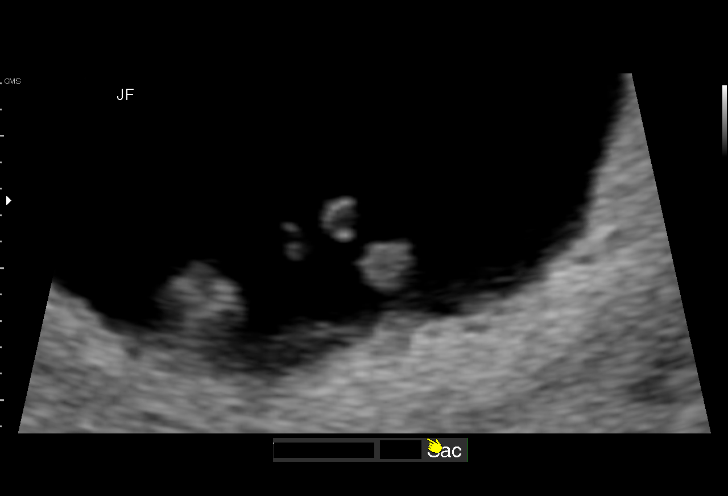

[15 of 28 positions shown; findings below may reference images not displayed]

FINDINGS: Intrauterine gestational sac: Present

Yolk sac:  Present

Embryo:  Present

Cardiac Activity: Not visualized

Heart Rate: n/a bpm

CRL:   25.4  mm   9 w 1 d                  US EDC: September 11, 2016

Subchorionic hemorrhage:  None visualized.

Maternal uterus/adnexae: Normal.  No free pelvic fluid.
IMPRESSION: No fetal cardiac activity detected today consistent with fetal
demise. This confirms the clinically suspected feel demise based on
bedside exam. No subchorionic hemorrhage.

Normal maternal adnexal structures.  No free pelvic fluid.

## 2017-11-21 ENCOUNTER — Encounter (HOSPITAL_COMMUNITY): Payer: Self-pay

## 2017-11-25 ENCOUNTER — Ambulatory Visit (HOSPITAL_COMMUNITY)
Admission: RE | Admit: 2017-11-25 | Discharge: 2017-11-25 | Disposition: A | Discharge status: Home / Self Care | Payer: 59 | Source: Ambulatory Visit | Attending: Obstetrics | Admitting: Obstetrics

## 2017-11-25 ENCOUNTER — Encounter (HOSPITAL_COMMUNITY): Payer: Self-pay

## 2017-11-25 HISTORY — DX: Cardiac murmur, unspecified: R01.1

## 2018-01-21 LAB — OB RESULTS CONSOLE GC/CHLAMYDIA
Chlamydia: NEGATIVE
Gonorrhea: NEGATIVE

## 2018-01-21 LAB — OB RESULTS CONSOLE ANTIBODY SCREEN: Antibody Screen: NEGATIVE

## 2018-01-21 LAB — OB RESULTS CONSOLE HIV ANTIBODY (ROUTINE TESTING): HIV: NONREACTIVE

## 2018-01-21 LAB — OB RESULTS CONSOLE ABO/RH: RH Type: NEGATIVE

## 2018-01-21 LAB — OB RESULTS CONSOLE HEPATITIS B SURFACE ANTIGEN: Hepatitis B Surface Ag: NEGATIVE

## 2018-01-21 LAB — OB RESULTS CONSOLE RUBELLA ANTIBODY, IGM: Rubella: IMMUNE

## 2018-01-21 LAB — OB RESULTS CONSOLE RPR: RPR: NONREACTIVE

## 2018-01-28 ENCOUNTER — Inpatient Hospital Stay (HOSPITAL_COMMUNITY)
Admission: AD | Admit: 2018-01-28 | Discharge: 2018-01-28 | Disposition: A | Discharge status: Home / Self Care | Payer: 59 | Source: Ambulatory Visit | Attending: Obstetrics and Gynecology | Admitting: Obstetrics and Gynecology

## 2018-01-28 ENCOUNTER — Inpatient Hospital Stay (HOSPITAL_COMMUNITY): Payer: 59

## 2018-01-28 ENCOUNTER — Other Ambulatory Visit: Payer: Self-pay

## 2018-01-28 ENCOUNTER — Encounter (HOSPITAL_COMMUNITY): Payer: Self-pay | Admitting: *Deleted

## 2018-01-28 DIAGNOSIS — O26891 Other specified pregnancy related conditions, first trimester: Secondary | ICD-10-CM

## 2018-01-28 DIAGNOSIS — O209 Hemorrhage in early pregnancy, unspecified: Secondary | ICD-10-CM | POA: Diagnosis not present

## 2018-01-28 DIAGNOSIS — O208 Other hemorrhage in early pregnancy: Secondary | ICD-10-CM | POA: Insufficient documentation

## 2018-01-28 DIAGNOSIS — Z6791 Unspecified blood type, Rh negative: Secondary | ICD-10-CM

## 2018-01-28 DIAGNOSIS — O468X1 Other antepartum hemorrhage, first trimester: Secondary | ICD-10-CM

## 2018-01-28 DIAGNOSIS — O418X10 Other specified disorders of amniotic fluid and membranes, first trimester, not applicable or unspecified: Secondary | ICD-10-CM | POA: Diagnosis not present

## 2018-01-28 DIAGNOSIS — Z3A01 Less than 8 weeks gestation of pregnancy: Secondary | ICD-10-CM | POA: Insufficient documentation

## 2018-01-28 DIAGNOSIS — O23591 Infection of other part of genital tract in pregnancy, first trimester: Secondary | ICD-10-CM | POA: Diagnosis not present

## 2018-01-28 DIAGNOSIS — Z3491 Encounter for supervision of normal pregnancy, unspecified, first trimester: Secondary | ICD-10-CM

## 2018-01-28 DIAGNOSIS — B9689 Other specified bacterial agents as the cause of diseases classified elsewhere: Secondary | ICD-10-CM

## 2018-01-28 DIAGNOSIS — N760 Acute vaginitis: Secondary | ICD-10-CM | POA: Diagnosis not present

## 2018-01-28 HISTORY — DX: Unspecified infectious disease: B99.9

## 2018-01-28 LAB — CBC
HEMATOCRIT: 34.7 % — AB (ref 36.0–46.0)
Hemoglobin: 11.6 g/dL — ABNORMAL LOW (ref 12.0–15.0)
MCH: 27.4 pg (ref 26.0–34.0)
MCHC: 33.4 g/dL (ref 30.0–36.0)
MCV: 82 fL (ref 78.0–100.0)
PLATELETS: 318 10*3/uL (ref 150–400)
RBC: 4.23 MIL/uL (ref 3.87–5.11)
RDW: 16.9 % — ABNORMAL HIGH (ref 11.5–15.5)
WBC: 10.9 10*3/uL — ABNORMAL HIGH (ref 4.0–10.5)

## 2018-01-28 LAB — URINALYSIS, ROUTINE W REFLEX MICROSCOPIC
BACTERIA UA: NONE SEEN
Bilirubin Urine: NEGATIVE
Glucose, UA: NEGATIVE mg/dL
Ketones, ur: 5 mg/dL — AB
Leukocytes, UA: NEGATIVE
NITRITE: NEGATIVE
PROTEIN: NEGATIVE mg/dL
Specific Gravity, Urine: 1.025 (ref 1.005–1.030)
pH: 6 (ref 5.0–8.0)

## 2018-01-28 LAB — WET PREP, GENITAL
Sperm: NONE SEEN
Trich, Wet Prep: NONE SEEN
YEAST WET PREP: NONE SEEN

## 2018-01-28 LAB — POCT PREGNANCY, URINE: Preg Test, Ur: POSITIVE — AB

## 2018-01-28 LAB — HCG, QUANTITATIVE, PREGNANCY: hCG, Beta Chain, Quant, S: 70336 m[IU]/mL — ABNORMAL HIGH (ref <5)

## 2018-01-28 MED ORDER — METRONIDAZOLE 500 MG PO TABS: 500.0000 mg | ORAL_TABLET | Freq: Two times a day (BID) | ORAL | 0 refills | Status: DC

## 2018-01-28 MED ORDER — RHO D IMMUNE GLOBULIN 1500 UNIT/2ML IJ SOSY
300.0000 ug | PREFILLED_SYRINGE | Freq: Once | INTRAMUSCULAR | Status: AC
  Administered 2018-01-28: 300 ug via INTRAMUSCULAR
  Filled 2018-01-28: qty 2

## 2018-01-28 MED ORDER — CITRANATAL BLOOM 90-1 MG PO TABS: 1.0000 | ORAL_TABLET | Freq: Every day | ORAL | 0 refills | Status: DC

## 2018-01-28 NOTE — MAU Note (Signed)
Is 7 wks preg.   Threw up real bad today.  After she threw up, she went to the bathroom and saw some blood, just when wiped. No pain.  Has been seen in the office for the preg, last wk.

## 2018-01-28 NOTE — Discharge Instructions (Signed)
Subchorionic Hematoma °A subchorionic hematoma is a gathering of blood between the outer wall of the placenta and the inner wall of the womb (uterus). The placenta is the organ that connects the fetus to the wall of the uterus. The placenta performs the feeding, breathing (oxygen to the fetus), and waste removal (excretory work) of the fetus. °Subchorionic hematoma is the most common abnormality found on a result from ultrasonography done during the first trimester or early second trimester of pregnancy. If there has been little or no vaginal bleeding, early small hematomas usually shrink on their own and do not affect your baby or pregnancy. The blood is gradually absorbed over 1-2 weeks. When bleeding starts later in pregnancy or the hematoma is larger or occurs in an older pregnant woman, the outcome may not be as good. Larger hematomas may get bigger, which increases the chances for miscarriage. Subchorionic hematoma also increases the risk of premature detachment of the placenta from the uterus, preterm (premature) labor, and stillbirth. °Follow these instructions at home: °· Stay on bed rest if your health care provider recommends this. Although bed rest will not prevent more bleeding or prevent a miscarriage, your health care provider may recommend bed rest until you are advised otherwise. °· Avoid heavy lifting (more than 10 lb [4.5 kg]), exercise, sexual intercourse, or douching as directed by your health care provider. °· Keep track of the number of pads you use each day and how soaked (saturated) they are. Write down this information. °· Do not use tampons. °· Keep all follow-up appointments as directed by your health care provider. Your health care provider may ask you to have follow-up blood tests or ultrasound tests or both. °Get help right away if: °· You have severe cramps in your stomach, back, abdomen, or pelvis. °· You have a fever. °· You pass large clots or tissue. Save any tissue for your  health care provider to look at. °· Your bleeding increases or you become lightheaded, feel weak, or have fainting episodes. °This information is not intended to replace advice given to you by your health care provider. Make sure you discuss any questions you have with your health care provider. °Document Released: 08/29/2006 Document Revised: 10/20/2015 Document Reviewed: 12/11/2012 °Elsevier Interactive Patient Education © 2017 [employer] ° °Bacterial Vaginosis °Bacterial vaginosis is a vaginal infection that occurs when the normal balance of bacteria in the vagina is disrupted. It results from an overgrowth of certain bacteria. This is the most common vaginal infection among women ages 15-44. °Because bacterial vaginosis increases your risk for STIs (sexually transmitted infections), getting treated can help reduce your risk for chlamydia, gonorrhea, herpes, and HIV (human immunodeficiency virus). Treatment is also important for preventing complications in pregnant women, because this condition can cause an early (premature) delivery. °What are the causes? °This condition is caused by an increase in harmful bacteria that are normally present in small amounts in the vagina. However, the reason that the condition develops is not fully understood. °What increases the risk? °The following factors may make you more likely to develop this condition: °· Having a new sexual partner or multiple sexual partners. °· Having unprotected sex. °· Douching. °· Having an intrauterine device (IUD). °· Smoking. °· Drug and alcohol abuse. °· Taking certain antibiotic medicines. °· Being pregnant. ° °You cannot get bacterial vaginosis from toilet seats, bedding, swimming pools, or contact with objects around you. °What are the signs or symptoms? °Symptoms of this condition include: °· Grey or white   vaginal discharge. The discharge can also be watery or foamy. °· A fish-like odor with discharge, especially after sexual intercourse  or during menstruation. °· Itching in and around the vagina. °· Burning or pain with urination. ° °Some women with bacterial vaginosis have no signs or symptoms. °How is this diagnosed? °This condition is diagnosed based on: °· Your medical history. °· A physical exam of the vagina. °· Testing a sample of vaginal fluid under a microscope to look for a large amount of bad bacteria or abnormal cells. Your health care provider may use a cotton swab or a small wooden spatula to collect the sample. ° °How is this treated? °This condition is treated with antibiotics. These may be given as a pill, a vaginal cream, or a medicine that is put into the vagina (suppository). If the condition comes back after treatment, a second round of antibiotics may be needed. °Follow these instructions at home: °Medicines °· Take over-the-counter and prescription medicines only as told by your health care provider. °· Take or use your antibiotic as told by your health care provider. Do not stop taking or using the antibiotic even if you start to feel better. °General instructions °· If you have a female sexual partner, tell her that you have a vaginal infection. She should see her health care provider and be treated if she has symptoms. If you have a female sexual partner, he does not need treatment. °· During treatment: °? Avoid sexual activity until you finish treatment. °? Do not douche. °? Avoid alcohol as directed by your health care provider. °? Avoid breastfeeding as directed by your health care provider. °· Drink enough water and fluids to keep your urine clear or pale yellow. °· Keep the area around your vagina and rectum clean. °? Wash the area daily with warm water. °? Wipe yourself from front to back after using the toilet. °· Keep all follow-up visits as told by your health care provider. This is important. °How is this prevented? °· Do not douche. °· Wash the outside of your vagina with warm water only. °· Use protection when  having sex. This includes latex condoms and dental dams. °· Limit how many sexual partners you have. To help prevent bacterial vaginosis, it is best to have sex with just one partner (monogamous). °· Make sure you and your sexual partner are tested for STIs. °· Wear cotton or cotton-lined underwear. °· Avoid wearing tight pants and pantyhose, especially during summer. °· Limit the amount of alcohol that you drink. °· Do not use any products that contain nicotine or tobacco, such as cigarettes and e-cigarettes. If you need help quitting, ask your health care provider. °· Do not use illegal drugs. °Where to find more information: °· Centers for Disease Control and Prevention: www.cdc.gov/std °· American Sexual Health Association (ASHA): www.ashastd.org °· [place] Department of Health and Human Services, Office on Women's Health: www.womenshealth.gov/ or https://www.womenshealth.gov/a-z-topics/bacterial-vaginosis °Contact a health care provider if: °· Your symptoms do not improve, even after treatment. °· You have more discharge or pain when urinating. °· You have a fever. °· You have pain in your abdomen. °· You have pain during sex. °· You have vaginal bleeding between periods. °Summary °· Bacterial vaginosis is a vaginal infection that occurs when the normal balance of bacteria in the vagina is disrupted. °· Because bacterial vaginosis increases your risk for STIs (sexually transmitted infections), getting treated can help reduce your risk for chlamydia, gonorrhea, herpes, and HIV (human immunodeficiency virus). Treatment   is also important for preventing complications in pregnant women, because the condition can cause an early (premature) delivery. °· This condition is treated with antibiotic medicines. These may be given as a pill, a vaginal cream, or a medicine that is put into the vagina (suppository). °This information is not intended to replace advice given to you by your health care provider. Make sure you  discuss any questions you have with your health care provider. °Document Released: 05/14/2005 Document Revised: 09/17/2016 Document Reviewed: 01/28/2016 °Elsevier Interactive Patient Education © 2018 [employer] ° °

## 2018-01-28 NOTE — MAU Provider Note (Addendum)
History     CSN: [id number]  Arrival date and time: 01/28/18 0737   First Provider Initiated Contact with Patient 01/28/18 1900      Chief Complaint  Patient presents with  . Vaginal Bleeding  . Emesis   HPI  Kristin Medina is a 27 y.o. G6P0140 at [redacted] weeks GA by LMP. Patient presents to MAU with chief complaint of vaginal bleeding. This is a new problem. Patient states she ate a steak and cheese sandwich today, had a brief episode of extreme dry heaving and vomiting, then saw bright red blood when she wiped after voiding. Denies concerns about frequency of nausea/vomiting, fever, falls.  OB History    Gravida  6   Para  1   Term  0   Preterm  1   AB  4   Living  0     SAB  2   TAB  2   Ectopic  0   Multiple  0   Live Births              Past Medical History:  Diagnosis Date  . Heart murmur   . Infection    UTI  . Missed ab    x 1 - no surgery required  . Preterm labor   . SVD (spontaneous vaginal delivery)    x 1 - Stillbirth  . Termination of pregnancy (fetus)    x 2    Past Surgical History:  Procedure Laterality Date  . BREAST LUMPECTOMY Left    fibroid adenoma removed  . DILATION AND CURETTAGE OF UTERUS N/A 02/10/2016   Procedure: SUCTION DILATATION AND CURETTAGE;  Surgeon: Hermina Staggers, MD;  Location: [facility];  Service: Gynecology;  Laterality: N/A;  . INDUCED ABORTION      Family History  Problem Relation Age of Onset  . Diabetes Father     Social History   Tobacco Use  . Smoking status: Never Smoker  . Smokeless tobacco: Never Used  Substance Use Topics  . Alcohol use: No  . Drug use: Not Currently    Types: Marijuana    Comment: last early Aug    Allergies: No Known Allergies  Facility-Administered Medications Prior to Admission  Medication Dose Route Frequency Provider Last Rate Last Dose  . Rho D Immune Globulin SOSY 1,500 Units  1,500 Units Intramuscular Once Hermina Staggers, MD       Medications Prior to  Admission  Medication Sig Dispense Refill Last Dose  . ibuprofen (ADVIL,MOTRIN) 800 MG tablet Take 1 tablet (800 mg total) by mouth every 8 (eight) hours as needed for moderate pain. 30 tablet 0   . oxyCODONE (OXY IR/ROXICODONE) 5 MG immediate release tablet Take 1-2 tablets (5-10 mg total) by mouth every 6 (six) hours as needed for severe pain. 24 tablet 0   . penicillin v potassium (VEETID) 500 MG tablet Take 1 tablet (500 mg total) by mouth 4 (four) times daily. 28 tablet 0   . Prenatal Vit-Fe Fumarate-FA (PRENATAL MULTIVITAMIN) TABS tablet Take 1 tablet by mouth daily at 12 noon.    Past Week at Unknown time  . traMADol (ULTRAM) 50 MG tablet Take 1 tablet (50 mg total) by mouth every 6 (six) hours as needed for severe pain. 15 tablet 0     Review of Systems  Constitutional: Negative for fatigue and fever.  Respiratory: Negative for shortness of breath.   Gastrointestinal: Positive for nausea and vomiting.  Genitourinary: Positive for vaginal bleeding.  All other systems reviewed and are negative.  Physical Exam   Blood pressure 125/72, pulse 67, temperature 98.5 F (36.9 C), temperature source Oral, resp. rate 17, height 5\' 5"  (1.651 m), weight 79.6 kg, last menstrual period 12/07/2017, SpO2 100 %, unknown if currently breastfeeding.  Physical Exam  Nursing note and vitals reviewed. Constitutional: She is oriented to person, place, and time. She appears well-developed and well-nourished.  Cardiovascular: Normal rate.  Respiratory: Effort normal.  GI: Soft. She exhibits no distension. There is no tenderness. There is no rebound.  Genitourinary:  Genitourinary Comments: Pale pink discharge visible on swab collection  Neurological: She is alert and oriented to person, place, and time. She has normal reflexes.  Skin: Skin is warm and dry.  Psychiatric: She has a normal mood and affect. Her behavior is normal. Judgment and thought content normal.    MAU Course   Procedures  MDM  Patient Vitals for the past 24 hrs:  BP Temp Temp src Pulse Resp SpO2 Height Weight  01/28/18 1848 125/72 98.5 F (36.9 C) Oral 67 17 100 % 5\' 5"  (1.651 m) 79.6 kg    Orders Placed This Encounter  Procedures  . Wet prep, genital  . US OB LESS THAN 14 WEEKS WITH OB TRANSVAGINAL  . Urinalysis, Routine w reflex microscopic  . CBC  . hCG, quantitative, pregnancy  . Pregnancy, urine POC  . Rh IG workup (includes ABO/Rh)   Results for orders placed or performed during the hospital encounter of 01/28/18 (from the past 24 hour(s))  Urinalysis, Routine w reflex microscopic     Status: Abnormal   Collection Time: 01/28/18  6:55 PM  Result Value Ref Range   Color, Urine YELLOW YELLOW   APPearance HAZY (A) CLEAR   Specific Gravity, Urine 1.025 1.005 - 1.030   pH 6.0 5.0 - 8.0   Glucose, UA NEGATIVE NEGATIVE mg/dL   Hgb urine dipstick LARGE (A) NEGATIVE   Bilirubin Urine NEGATIVE NEGATIVE   Ketones, ur 5 (A) NEGATIVE mg/dL   Protein, ur NEGATIVE NEGATIVE mg/dL   Nitrite NEGATIVE NEGATIVE   Leukocytes, UA NEGATIVE NEGATIVE   RBC / HPF 0-5 0 - 5 RBC/hpf   WBC, UA 0-5 0 - 5 WBC/hpf   Bacteria, UA NONE SEEN NONE SEEN   Squamous Epithelial / LPF 0-5 0 - 5   Mucus PRESENT   Pregnancy, urine POC     Status: Abnormal   Collection Time: 01/28/18  6:59 PM  Result Value Ref Range   Preg Test, Ur POSITIVE (A) NEGATIVE  Wet prep, genital     Status: Abnormal   Collection Time: 01/28/18  7:07 PM  Result Value Ref Range   Yeast Wet Prep HPF POC NONE SEEN NONE SEEN   Trich, Wet Prep NONE SEEN NONE SEEN   Clue Cells Wet Prep HPF POC PRESENT (A) NONE SEEN   WBC, Wet Prep HPF POC FEW (A) NONE SEEN   Sperm NONE SEEN   CBC     Status: Abnormal   Collection Time: 01/28/18  7:27 PM  Result Value Ref Range   WBC 10.9 (H) 4.0 - 10.5 K/uL   RBC 4.23 3.87 - 5.11 MIL/uL   Hemoglobin 11.6 (L) 12.0 - 15.0 g/dL   HCT 16.1 (L) 09.6 - 04.5 %   MCV 82.0 78.0 - 100.0 fL   MCH 27.4 26.0  - 34.0 pg   MCHC 33.4 30.0 - 36.0 g/dL   RDW 40.9 (H) 81.1 - 91.4 %  Platelets 318 150 - 400 K/uL   US Ob Less Than 14 Weeks With Ob Transvaginal  Result Date: 01/28/2018 CLINICAL DATA:  Vaginal bleeding. Estimated gestational age of [redacted] weeks, 3 days by LMP. EXAM: OBSTETRIC <14 WK [place] AND TRANSVAGINAL OB US TECHNIQUE: Both transabdominal and transvaginal ultrasound examinations were performed for complete evaluation of the gestation as well as the maternal uterus, adnexal regions, and pelvic cul-de-sac. Transvaginal technique was performed to assess early pregnancy. COMPARISON:  None. FINDINGS: Intrauterine gestational sac: Single Yolk sac:  Visualized. Embryo:  Visualized. Cardiac Activity: Visualized. Heart Rate: 162 bpm CRL:  12.9 mm   7 w   3 d                  [place] EDC: 09/13/2018 Subchorionic hemorrhage: Small 2.2 x 1.8 x 2.1 cm subchorionic hemorrhage. Maternal uterus/adnexae: Unremarkable. Trace free fluid in the pelvis, likely physiologic. IMPRESSION: 1. Single, live intrauterine pregnancy with estimated gestational age of [redacted] weeks, 3 days. 2. Small subchorionic hemorrhage. Electronically Signed   By: Obie Dredge M.D.   On: 01/28/2018 20:27    A NEG: Rhogam workup and administration Clue cells: plan to send home with rx for Flagyl     Plan of care discussed and approved by Dr. Henderson Cloud Patient awaiting Rhogam workup and administration before discharge. Report given to E. Lyman Bishop, NP  Clayton Bibles, CNM 01/28/18  9:28 PM     Pt given rhogam Prior to discharge, pt requesting prenatal vitamin that includes stool softener. RX sent in for citranatal bloom which is covered by her insurance Also sent rx for BV Assessment and Plan  A: 1. Normal IUP (intrauterine pregnancy) on prenatal ultrasound, first trimester   2. Vaginal bleeding in pregnancy, first trimester   3. Subchorionic hematoma in first trimester, single or unspecified fetus   4. Rh negative status during pregnancy in  first trimester   5. BV (bacterial vaginosis)    P: Discharge home Bleeding precautions Rx for PNV & flagyl F/u with OB  Judeth Horn, NP

## 2018-01-29 LAB — GC/CHLAMYDIA PROBE AMP (~~LOC~~) NOT AT ARMC
CHLAMYDIA, DNA PROBE: NEGATIVE
Neisseria Gonorrhea: NEGATIVE

## 2018-01-29 LAB — RH IG WORKUP (INCLUDES ABO/RH)
ABO/RH(D): A NEG
ANTIBODY SCREEN: NEGATIVE
Gestational Age(Wks): 7.3
UNIT DIVISION: 0

## 2018-03-09 ENCOUNTER — Inpatient Hospital Stay (HOSPITAL_COMMUNITY)
Admission: AD | Admit: 2018-03-09 | Discharge: 2018-03-09 | Disposition: A | Discharge status: Home / Self Care | Payer: 59 | Source: Ambulatory Visit | Attending: Obstetrics and Gynecology | Admitting: Obstetrics and Gynecology

## 2018-03-09 ENCOUNTER — Encounter (HOSPITAL_COMMUNITY): Payer: Self-pay

## 2018-03-09 DIAGNOSIS — Z3A13 13 weeks gestation of pregnancy: Secondary | ICD-10-CM | POA: Diagnosis not present

## 2018-03-09 DIAGNOSIS — O26891 Other specified pregnancy related conditions, first trimester: Secondary | ICD-10-CM | POA: Insufficient documentation

## 2018-03-09 DIAGNOSIS — Z6791 Unspecified blood type, Rh negative: Secondary | ICD-10-CM | POA: Diagnosis not present

## 2018-03-09 DIAGNOSIS — O209 Hemorrhage in early pregnancy, unspecified: Secondary | ICD-10-CM | POA: Diagnosis not present

## 2018-03-09 DIAGNOSIS — N898 Other specified noninflammatory disorders of vagina: Secondary | ICD-10-CM | POA: Diagnosis present

## 2018-03-09 LAB — WET PREP, GENITAL
Clue Cells Wet Prep HPF POC: NONE SEEN
Sperm: NONE SEEN
Trich, Wet Prep: NONE SEEN
Yeast Wet Prep HPF POC: NONE SEEN

## 2018-03-09 LAB — URINALYSIS, ROUTINE W REFLEX MICROSCOPIC
BILIRUBIN URINE: NEGATIVE
Glucose, UA: NEGATIVE mg/dL
Hgb urine dipstick: NEGATIVE
Ketones, ur: NEGATIVE mg/dL
Leukocytes, UA: NEGATIVE
NITRITE: NEGATIVE
PROTEIN: NEGATIVE mg/dL
SPECIFIC GRAVITY, URINE: 1.019 (ref 1.005–1.030)
pH: 9 — ABNORMAL HIGH (ref 5.0–8.0)

## 2018-03-09 NOTE — MAU Provider Note (Signed)
Chief Complaint: Vaginal Bleeding and Vaginal Discharge   First Provider Initiated Contact with Patient 03/09/18 1947     SUBJECTIVE HPI: Kristin Medina is a 27 y.o. G6P0140 at [redacted]w[redacted]d who presents to Maternity Admissions reporting brown vaginal discharge today.  Was seen in maternity admissions 01/28/2018.  Diagnosed with subchorionic hemorrhage.  Received Rhophylac.  Has been seen at [facility] Dr. Chestine Spore several times since then with normal ultrasound per patient.  Associated signs and symptoms: Negative for fever, chills, abdominal pain, leaking of fluid, vaginal discharge, vaginal odor or vaginal itching.  No recent intercourse.  Blood type a negative  Past Medical History:  Diagnosis Date  . Heart murmur   . Infection    UTI  . Missed ab    x 1 - no surgery required  . Preterm labor   . SVD (spontaneous vaginal delivery)    x 1 - Stillbirth  . Termination of pregnancy (fetus)    x 2   OB History  Gravida Para Term Preterm AB Living  6 1 0 1 4 0  SAB TAB Ectopic Multiple Live Births  2 2 0 0      # Outcome Date GA Lbr Len/2nd Weight Sex Delivery Anes PTL Lv  6 Current           5 SAB 2016          4 Preterm 05/17/13 [redacted]w[redacted]d 21:25 / 00:16 921 g M Vag-Spont EPI  FD  3 TAB 2009          2 TAB 2008          1 SAB            Past Surgical History:  Procedure Laterality Date  . BREAST LUMPECTOMY Left    fibroid adenoma removed  . DILATION AND CURETTAGE OF UTERUS N/A 02/10/2016   Procedure: SUCTION DILATATION AND CURETTAGE;  Surgeon: Hermina Staggers, MD;  Location: [facility];  Service: Gynecology;  Laterality: N/A;  . INDUCED ABORTION     Social History   Socioeconomic History  . Marital status: Single    Spouse name: Not on file  . Number of children: Not on file  . Years of education: Not on file  . Highest education level: Not on file  Occupational History  . Not on file  Social Needs  . Financial resource strain: Not on file  . Food insecurity:    Worry:  Not on file    Inability: Not on file  . Transportation needs:    Medical: Not on file    Non-medical: Not on file  Tobacco Use  . Smoking status: Never Smoker  . Smokeless tobacco: Never Used  Substance and Sexual Activity  . Alcohol use: No  . Drug use: Not Currently    Types: Marijuana    Comment: last early Aug  . Sexual activity: Yes    Birth control/protection: None  Lifestyle  . Physical activity:    Days per week: Not on file    Minutes per session: Not on file  . Stress: Not on file  Relationships  . Social connections:    Talks on phone: Not on file    Gets together: Not on file    Attends religious service: Not on file    Active member of club or organization: Not on file    Attends meetings of clubs or organizations: Not on file    Relationship status: Not on file  . Intimate  partner violence:    Fear of current or ex partner: Not on file    Emotionally abused: Not on file    Physically abused: Not on file    Forced sexual activity: Not on file  Other Topics Concern  . Not on file  Social History Narrative  . Not on file   Family History  Problem Relation Age of Onset  . Diabetes Father    No current facility-administered medications on file prior to encounter.    Current Outpatient Medications on File Prior to Encounter  Medication Sig Dispense Refill  . metroNIDAZOLE (FLAGYL) 500 MG tablet Take 1 tablet (500 mg total) by mouth 2 (two) times daily. 14 tablet 0  . Prenatal Vit-Fe Fumarate-FA (PRENATAL MULTIVITAMIN) TABS tablet Take 1 tablet by mouth daily at 12 noon.     . Prenatal-DSS-FeCb-FeGl-FA (CITRANATAL BLOOM) 90-1 MG TABS Take 1 tablet by mouth daily. 30 tablet 0   No Known Allergies  I have reviewed patient's Past Medical Hx, Surgical Hx, Family Hx, Social Hx, medications and allergies.   Review of Systems  Constitutional: Negative for chills and fever.  Gastrointestinal: Negative for abdominal pain.  Genitourinary: Positive for vaginal  bleeding Manson Passey). Negative for hematuria and vaginal discharge.    OBJECTIVE Patient Vitals for the past 24 hrs:  BP Temp Temp src Pulse Weight  03/09/18 1557 129/74 98.2 F (36.8 C) Oral 83 82 kg   Constitutional: Well-developed, well-nourished female in no acute distress.  Cardiovascular: normal rate Respiratory: normal rate and effort.  GI: Abd soft, non-tender, gravid appropriate for gestational age. MS: Extremities nontender, no edema, normal ROM Neurologic: Alert and oriented x 4.  GU: Neg CVAT.  SPECULUM EXAM: NEFG, small amount of brown blood noted, cervix clean.  No polyps, friability or ectropion.  BIMANUAL: cervix closed; uterus size equals dates, no adnexal tenderness or masses. No CMT.  LAB RESULTS Results for orders placed or performed during the hospital encounter of 03/09/18 (from the past 24 hour(s))  Urinalysis, Routine w reflex microscopic     Status: Abnormal   Collection Time: 03/09/18  4:59 PM  Result Value Ref Range   Color, Urine YELLOW YELLOW   APPearance CLOUDY (A) CLEAR   Specific Gravity, Urine 1.019 1.005 - 1.030   pH 9.0 (H) 5.0 - 8.0   Glucose, UA NEGATIVE NEGATIVE mg/dL   Hgb urine dipstick NEGATIVE NEGATIVE   Bilirubin Urine NEGATIVE NEGATIVE   Ketones, ur NEGATIVE NEGATIVE mg/dL   Protein, ur NEGATIVE NEGATIVE mg/dL   Nitrite NEGATIVE NEGATIVE   Leukocytes, UA NEGATIVE NEGATIVE  Wet prep, genital     Status: Abnormal   Collection Time: 03/09/18  6:06 PM  Result Value Ref Range   Yeast Wet Prep HPF POC NONE SEEN NONE SEEN   Trich, Wet Prep NONE SEEN NONE SEEN   Clue Cells Wet Prep HPF POC NONE SEEN NONE SEEN   WBC, Wet Prep HPF POC MODERATE (A) NONE SEEN   Sperm NONE SEEN     IMAGING No results found.  MAU COURSE Orders Placed This Encounter  Procedures  . Wet prep, genital  . Urinalysis, Routine w reflex microscopic  . Discharge patient   No orders of the defined types were placed in this encounter.   MDM -Small amount of  old blood likely left over from subchorionic hemorrhage.  Normal fetal heart tones.  Cervix long and closed.  Normal wet prep.  No evidence of infection or impending miscarriage.  Patient reassured.  ASSESSMENT  1. Vaginal bleeding in pregnancy, first trimester   2. Rh negative status during pregnancy in first trimester     PLAN Discharge home in stable condition. Bleeding in first trimester precautions Follow-up Information    Marlow Baars, MD Follow up.   Specialty:  Obstetrics Why:  As scheduled or sooner as needed if symptoms worsen Contact information: [address] [place] [address] [place] [address] [phone]        [facility] Follow up.   Why:  As needed in pregnancy emergencies Contact information: [address] [id number] m[place] [address] [phone]         Allergies as of 03/09/2018   No Known Allergies     Medication List    STOP taking these medications   prenatal multivitamin Tabs tablet     TAKE these medications   CITRANATAL BLOOM 90-1 MG Tabs Take 1 tablet by mouth daily.   metroNIDAZOLE 500 MG tablet Commonly known as:  FLAGYL Take 1 tablet (500 mg total) by mouth 2 (two) times daily.        Katrinka Blazing, [place], [place] 03/09/2018  8:19 PM

## 2018-03-09 NOTE — MAU Note (Signed)
Reports dark brown discharge today  Has not noticed bright red bleeding, no pain

## 2018-03-09 NOTE — Discharge Instructions (Signed)

## 2018-03-10 LAB — GC/CHLAMYDIA PROBE AMP (~~LOC~~) NOT AT ARMC
Chlamydia: NEGATIVE
Neisseria Gonorrhea: NEGATIVE

## 2018-03-12 ENCOUNTER — Inpatient Hospital Stay (HOSPITAL_COMMUNITY)
Admission: AD | Admit: 2018-03-12 | Discharge: 2018-03-12 | Disposition: A | Discharge status: Home / Self Care | Payer: 59 | Source: Ambulatory Visit | Attending: Obstetrics and Gynecology | Admitting: Obstetrics and Gynecology

## 2018-03-12 ENCOUNTER — Encounter (HOSPITAL_COMMUNITY): Payer: Self-pay

## 2018-03-12 DIAGNOSIS — O209 Hemorrhage in early pregnancy, unspecified: Secondary | ICD-10-CM | POA: Insufficient documentation

## 2018-03-12 DIAGNOSIS — O4692 Antepartum hemorrhage, unspecified, second trimester: Secondary | ICD-10-CM | POA: Diagnosis not present

## 2018-03-12 DIAGNOSIS — O418X20 Other specified disorders of amniotic fluid and membranes, second trimester, not applicable or unspecified: Secondary | ICD-10-CM

## 2018-03-12 DIAGNOSIS — O468X2 Other antepartum hemorrhage, second trimester: Secondary | ICD-10-CM

## 2018-03-12 DIAGNOSIS — Z3A13 13 weeks gestation of pregnancy: Secondary | ICD-10-CM | POA: Diagnosis not present

## 2018-03-12 LAB — URINALYSIS, ROUTINE W REFLEX MICROSCOPIC
Bilirubin Urine: NEGATIVE
Glucose, UA: NEGATIVE mg/dL
Ketones, ur: NEGATIVE mg/dL
Leukocytes, UA: NEGATIVE
Nitrite: NEGATIVE
PH: 5 (ref 5.0–8.0)
Protein, ur: NEGATIVE mg/dL
SPECIFIC GRAVITY, URINE: 1.029 (ref 1.005–1.030)

## 2018-03-12 NOTE — Discharge Instructions (Signed)
Subchorionic Hematoma °A subchorionic hematoma is a gathering of blood between the outer wall of the placenta and the inner wall of the womb (uterus). The placenta is the organ that connects the fetus to the wall of the uterus. The placenta performs the feeding, breathing (oxygen to the fetus), and waste removal (excretory work) of the fetus. °Subchorionic hematoma is the most common abnormality found on a result from ultrasonography done during the first trimester or early second trimester of pregnancy. If there has been little or no vaginal bleeding, early small hematomas usually shrink on their own and do not affect your baby or pregnancy. The blood is gradually absorbed over 1-2 weeks. When bleeding starts later in pregnancy or the hematoma is larger or occurs in an older pregnant woman, the outcome may not be as good. Larger hematomas may get bigger, which increases the chances for miscarriage. Subchorionic hematoma also increases the risk of premature detachment of the placenta from the uterus, preterm (premature) labor, and stillbirth. °Follow these instructions at home: °· Stay on bed rest if your health care provider recommends this. Although bed rest will not prevent more bleeding or prevent a miscarriage, your health care provider may recommend bed rest until you are advised otherwise. °· Avoid heavy lifting (more than 10 lb [4.5 kg]), exercise, sexual intercourse, or douching as directed by your health care provider. °· Keep track of the number of pads you use each day and how soaked (saturated) they are. Write down this information. °· Do not use tampons. °· Keep all follow-up appointments as directed by your health care provider. Your health care provider may ask you to have follow-up blood tests or ultrasound tests or both. °Get help right away if: °· You have severe cramps in your stomach, back, abdomen, or pelvis. °· You have a fever. °· You pass large clots or tissue. Save any tissue for your  health care provider to look at. °· Your bleeding increases or you become lightheaded, feel weak, or have fainting episodes. °This information is not intended to replace advice given to you by your health care provider. Make sure you discuss any questions you have with your health care provider. °Document Released: 08/29/2006 Document Revised: 10/20/2015 Document Reviewed: 12/11/2012 °Elsevier Interactive Patient Education © 2017 [employer]vier Inc. ° °

## 2018-03-12 NOTE — MAU Note (Signed)
Has had a diagnosed subchorionic hemorrhage earlier in the pregnancy-has had off and on bleeding that was scant and dark brown.  Today had an increase in her bleeding.  No clots/tissue passed.  No abdominal pain.  No recent intercourse.

## 2018-03-12 NOTE — MAU Provider Note (Signed)
History     CSN: [id number]  Arrival date and time: 03/12/18 2148   First Provider Initiated Contact with Patient 03/12/18 2211      Chief Complaint  Patient presents with  . Vaginal Bleeding   Kristin Medina is a 27 y.o. G6P0140 at [redacted]w[redacted]d who presents today with vaginal bleeding. She has a known IUP with [facility]. She was seen here 3 days ago, and states that she has had mostly brown bleeding off and on. However, today she wiped after using the bathroom, and had to wipe 2-3 times until there wasn't anymore red blood seen. Next visit 03/21/18   Vaginal Bleeding  The patient's primary symptoms include vaginal bleeding. This is a new problem. The current episode started in the past 7 days. The problem occurs intermittently. The problem has been resolved. The patient is experiencing no pain. She is pregnant. The vaginal discharge was bloody. The vaginal bleeding is spotting. She has not been passing clots. She has not been passing tissue. Exacerbated by:   She has tried nothing for the symptoms. Sexual activity: denies intercourse in the last 24 hours.    OB History    Gravida  6   Para  1   Term  0   Preterm  1   AB  4   Living  0     SAB  2   TAB  2   Ectopic  0   Multiple  0   Live Births              Past Medical History:  Diagnosis Date  . Heart murmur   . Infection    UTI  . Missed ab    x 1 - no surgery required  . Preterm labor   . SVD (spontaneous vaginal delivery)    x 1 - Stillbirth  . Termination of pregnancy (fetus)    x 2    Past Surgical History:  Procedure Laterality Date  . BREAST LUMPECTOMY Left    fibroid adenoma removed  . DILATION AND CURETTAGE OF UTERUS N/A 02/10/2016   Procedure: SUCTION DILATATION AND CURETTAGE;  Surgeon: Hermina Staggers, MD;  Location: [facility];  Service: Gynecology;  Laterality: N/A;  . INDUCED ABORTION      Family History  Problem Relation Age of Onset  . Diabetes Father     Social History   Tobacco Use   . Smoking status: Never Smoker  . Smokeless tobacco: Never Used  Substance Use Topics  . Alcohol use: No  . Drug use: Not Currently    Types: Marijuana    Comment: last early Aug    Allergies: No Known Allergies  Medications Prior to Admission  Medication Sig Dispense Refill Last Dose  . metroNIDAZOLE (FLAGYL) 500 MG tablet Take 1 tablet (500 mg total) by mouth 2 (two) times daily. 14 tablet 0   . Prenatal-DSS-FeCb-FeGl-FA (CITRANATAL BLOOM) 90-1 MG TABS Take 1 tablet by mouth daily. 30 tablet 0     Review of Systems  Genitourinary: Positive for vaginal bleeding.   Physical Exam   Blood pressure 119/72, pulse 78, temperature 97.9 F (36.6 C), resp. rate 17, last menstrual period 12/07/2017, SpO2 100 %, unknown if currently breastfeeding.  Physical Exam  Nursing note and vitals reviewed. Constitutional: She is oriented to person, place, and time. She appears well-developed and well-nourished. No distress.  HENT:  Head: Normocephalic.  Cardiovascular: Normal rate.  Respiratory: Effort normal.  GI: There is no tenderness. There is  no rebound.  Genitourinary:  Genitourinary Comments:  External: no lesion Vagina: small amount of brown blood seen  Cervix: pink, smooth, no active bleeding noted. Closed/thick  Uterus: AGA, FHT 154 with doppler    Neurological: She is alert and oriented to person, place, and time.  Skin: Skin is warm and dry.  Psychiatric: She has a normal mood and affect.   Results for orders placed or performed during the hospital encounter of 03/12/18 (from the past 24 hour(s))  Urinalysis, Routine w reflex microscopic     Status: Abnormal   Collection Time: 03/12/18 10:08 PM  Result Value Ref Range   Color, Urine YELLOW YELLOW   APPearance HAZY (A) CLEAR   Specific Gravity, Urine 1.029 1.005 - 1.030   pH 5.0 5.0 - 8.0   Glucose, UA NEGATIVE NEGATIVE mg/dL   Hgb urine dipstick LARGE (A) NEGATIVE   Bilirubin Urine NEGATIVE NEGATIVE   Ketones, ur  NEGATIVE NEGATIVE mg/dL   Protein, ur NEGATIVE NEGATIVE mg/dL   Nitrite NEGATIVE NEGATIVE   Leukocytes, UA NEGATIVE NEGATIVE   RBC / HPF 6-10 0 - 5 RBC/hpf   WBC, UA 0-5 0 - 5 WBC/hpf   Bacteria, UA RARE (A) NONE SEEN   Squamous Epithelial / LPF 0-5 0 - 5   Mucus PRESENT      MAU Course  Procedures  MDM Long discussion with patient about [facility] and bleeding that is expected. Patient is reassured that cervix is closed and +FHT today. She has an appointment next week in the office. Advised to keep scheduled appointment. Bleeding precautions reviewed at length with patient.   Assessment and Plan   1. Vaginal bleeding in pregnancy, second trimester   2. [redacted] weeks gestation of pregnancy   3. Subchorionic hematoma in second trimester, single or unspecified fetus    DC home UC pending  Comfort measures reviewed  2nd Trimester precautions  Bleeding precautions PTL precautions  RX: no new Rx  Return to MAU as needed FU with OB as planned for 03/21/18   Follow-up Information    Marlow Baars, MD Follow up.   Specialty:  Obstetrics Contact information: [address] [place] [address] [phone]           Thressa Sheller 03/12/2018, 10:12 PM

## 2018-03-14 LAB — CULTURE, OB URINE: Culture: <10000 — AB

## 2018-08-15 LAB — OB RESULTS CONSOLE GBS: GBS: NEGATIVE

## 2018-09-02 ENCOUNTER — Other Ambulatory Visit: Payer: Self-pay | Admitting: Obstetrics and Gynecology

## 2018-09-03 ENCOUNTER — Telehealth (HOSPITAL_COMMUNITY): Payer: Self-pay | Admitting: *Deleted

## 2018-09-03 ENCOUNTER — Encounter (HOSPITAL_COMMUNITY): Payer: Self-pay | Admitting: *Deleted

## 2018-09-03 NOTE — Telephone Encounter (Signed)
Preadmission screen  

## 2018-09-05 ENCOUNTER — Other Ambulatory Visit (HOSPITAL_COMMUNITY): Payer: Self-pay | Admitting: *Deleted

## 2018-09-08 ENCOUNTER — Inpatient Hospital Stay (HOSPITAL_COMMUNITY): Payer: 59 | Admitting: Anesthesiology

## 2018-09-08 ENCOUNTER — Other Ambulatory Visit: Payer: Self-pay

## 2018-09-08 ENCOUNTER — Inpatient Hospital Stay (HOSPITAL_COMMUNITY)
Admission: AD | Admit: 2018-09-08 | Discharge: 2018-09-11 | DRG: 787 | Disposition: A | Discharge status: Home / Self Care | Payer: 59 | Attending: Obstetrics | Admitting: Obstetrics

## 2018-09-08 ENCOUNTER — Inpatient Hospital Stay (HOSPITAL_COMMUNITY): Payer: 59

## 2018-09-08 ENCOUNTER — Encounter (HOSPITAL_COMMUNITY): Payer: Self-pay

## 2018-09-08 DIAGNOSIS — O99214 Obesity complicating childbirth: Principal | ICD-10-CM | POA: Diagnosis present

## 2018-09-08 DIAGNOSIS — Z9889 Other specified postprocedural states: Secondary | ICD-10-CM

## 2018-09-08 DIAGNOSIS — O26893 Other specified pregnancy related conditions, third trimester: Secondary | ICD-10-CM | POA: Diagnosis present

## 2018-09-08 DIAGNOSIS — Z332 Encounter for elective termination of pregnancy: Secondary | ICD-10-CM | POA: Diagnosis present

## 2018-09-08 DIAGNOSIS — O9081 Anemia of the puerperium: Secondary | ICD-10-CM | POA: Diagnosis not present

## 2018-09-08 DIAGNOSIS — Z8759 Personal history of other complications of pregnancy, childbirth and the puerperium: Secondary | ICD-10-CM

## 2018-09-08 DIAGNOSIS — Z3A39 39 weeks gestation of pregnancy: Secondary | ICD-10-CM | POA: Diagnosis not present

## 2018-09-08 DIAGNOSIS — D62 Acute posthemorrhagic anemia: Secondary | ICD-10-CM | POA: Diagnosis not present

## 2018-09-08 LAB — TYPE AND SCREEN
ABO/RH(D): A NEG
Antibody Screen: NEGATIVE

## 2018-09-08 LAB — CBC
HCT: 34.3 % — ABNORMAL LOW (ref 36.0–46.0)
Hemoglobin: 10.8 g/dL — ABNORMAL LOW (ref 12.0–15.0)
MCH: 27.1 pg (ref 26.0–34.0)
MCHC: 31.5 g/dL (ref 30.0–36.0)
MCV: 86 fL (ref 80.0–100.0)
Platelets: 275 10*3/uL (ref 150–400)
RBC: 3.99 MIL/uL (ref 3.87–5.11)
RDW: 16.5 % — ABNORMAL HIGH (ref 11.5–15.5)
WBC: 7.6 10*3/uL (ref 4.0–10.5)
nRBC: 0 % (ref 0.0–0.2)

## 2018-09-08 LAB — ABO/RH: ABO/RH(D): A NEG

## 2018-09-08 LAB — RPR: RPR Ser Ql: NONREACTIVE

## 2018-09-08 MED ORDER — EPHEDRINE 5 MG/ML INJ: 10.0000 mg | INTRAVENOUS | Status: DC | PRN

## 2018-09-08 MED ORDER — OXYTOCIN BOLUS FROM INFUSION: 500.0000 mL | Freq: Once | INTRAVENOUS | Status: DC

## 2018-09-08 MED ORDER — TERBUTALINE SULFATE 1 MG/ML IJ SOLN: 0.2500 mg | Freq: Once | INTRAMUSCULAR | Status: DC | PRN

## 2018-09-08 MED ORDER — LIDOCAINE-EPINEPHRINE (PF) 2 %-1:200000 IJ SOLN
INTRAMUSCULAR | Status: DC | PRN
  Administered 2018-09-08: 2 mL via EPIDURAL
  Administered 2018-09-08: 3 mL via EPIDURAL
  Administered 2018-09-09: 5 mL via EPIDURAL
  Administered 2018-09-09: 10 mL via EPIDURAL

## 2018-09-08 MED ORDER — ONDANSETRON HCL 4 MG/2ML IJ SOLN: 4.0000 mg | Freq: Four times a day (QID) | INTRAMUSCULAR | Status: DC | PRN

## 2018-09-08 MED ORDER — ACETAMINOPHEN 325 MG PO TABS: 650.0000 mg | ORAL_TABLET | ORAL | Status: DC | PRN

## 2018-09-08 MED ORDER — OXYTOCIN 40 UNITS IN NORMAL SALINE INFUSION - SIMPLE MED
1.0000 m[IU]/min | INTRAVENOUS | Status: DC
  Administered 2018-09-08: 2 m[IU]/min via INTRAVENOUS
  Filled 2018-09-08: qty 1000

## 2018-09-08 MED ORDER — LIDOCAINE HCL (PF) 1 % IJ SOLN
30.0000 mL | INTRAMUSCULAR | Status: DC | PRN
  Filled 2018-09-08: qty 30

## 2018-09-08 MED ORDER — LACTATED RINGERS IV SOLN
INTRAVENOUS | Status: DC
  Administered 2018-09-08 – 2018-09-09 (×4): via INTRAVENOUS

## 2018-09-08 MED ORDER — SODIUM CHLORIDE (PF) 0.9 % IJ SOLN
INTRAMUSCULAR | Status: DC | PRN
  Administered 2018-09-08: 14 mL/h via EPIDURAL

## 2018-09-08 MED ORDER — MISOPROSTOL 25 MCG QUARTER TABLET
25.0000 ug | ORAL_TABLET | ORAL | Status: DC | PRN
  Administered 2018-09-08 (×2): 25 ug via VAGINAL
  Filled 2018-09-08 (×2): qty 1

## 2018-09-08 MED ORDER — CEFAZOLIN SODIUM-DEXTROSE 2-4 GM/100ML-% IV SOLN
2.0000 g | Freq: Once | INTRAVENOUS | Status: AC
  Administered 2018-09-09: 2 g via INTRAVENOUS

## 2018-09-08 MED ORDER — PHENYLEPHRINE 40 MCG/ML (10ML) SYRINGE FOR IV PUSH (FOR BLOOD PRESSURE SUPPORT): 80.0000 ug | PREFILLED_SYRINGE | INTRAVENOUS | Status: DC | PRN

## 2018-09-08 MED ORDER — LACTATED RINGERS IV SOLN: 500.0000 mL | INTRAVENOUS | Status: DC | PRN

## 2018-09-08 MED ORDER — SOD CITRATE-CITRIC ACID 500-334 MG/5ML PO SOLN
30.0000 mL | ORAL | Status: DC | PRN
  Administered 2018-09-09: 30 mL via ORAL
  Filled 2018-09-08: qty 15

## 2018-09-08 MED ORDER — LACTATED RINGERS IV SOLN
500.0000 mL | Freq: Once | INTRAVENOUS | Status: AC
  Administered 2018-09-08: 500 mL via INTRAVENOUS

## 2018-09-08 MED ORDER — DIPHENHYDRAMINE HCL 50 MG/ML IJ SOLN: 12.5000 mg | INTRAMUSCULAR | Status: DC | PRN

## 2018-09-08 MED ORDER — FENTANYL-BUPIVACAINE-NACL 0.5-0.125-0.9 MG/250ML-% EP SOLN
12.0000 mL/h | EPIDURAL | Status: DC | PRN
  Filled 2018-09-08: qty 250

## 2018-09-08 MED ORDER — FENTANYL CITRATE (PF) 100 MCG/2ML IJ SOLN: 100.0000 ug | INTRAMUSCULAR | Status: DC | PRN

## 2018-09-08 MED ORDER — OXYTOCIN 40 UNITS IN NORMAL SALINE INFUSION - SIMPLE MED: 2.5000 [IU]/h | INTRAVENOUS | Status: DC

## 2018-09-08 MED ORDER — PHENYLEPHRINE 40 MCG/ML (10ML) SYRINGE FOR IV PUSH (FOR BLOOD PRESSURE SUPPORT)
80.0000 ug | PREFILLED_SYRINGE | INTRAVENOUS | Status: DC | PRN
  Filled 2018-09-08: qty 10

## 2018-09-08 NOTE — Progress Notes (Signed)
Comfortable w epidural  BP 125/89   Pulse 74   Temp 98.1 F (36.7 C) (Oral)   Resp 16   Ht 5\' 3"  (1.6 m)   Wt 104 kg   LMP 12/07/2017   BMI 40.60 kg/m   Toco: q2-5 minutes EFM: 130s, moderate variability, + accels, category 1 SVE: 4/70/-2, AROM clear fluid  A/P:  D2K0254 @ [redacted]w[redacted]d w EIOL, h/o IUFD Continue pitocin FSR

## 2018-09-08 NOTE — Anesthesia Procedure Notes (Signed)
Epidural Patient location during procedure: OB Start time: 09/08/2018 11:55 AM End time: 09/08/2018 12:10 PM  Staffing Anesthesiologist: Elmer Picker, MD Performed: anesthesiologist   Preanesthetic Checklist Completed: patient identified, pre-op evaluation, timeout performed, IV checked, risks and benefits discussed and monitors and equipment checked  Epidural Patient position: sitting Prep: site prepped and draped and DuraPrep Patient monitoring: continuous pulse ox, blood pressure, heart rate and cardiac monitor Approach: midline Location: L3-L4 Injection technique: LOR air  Needle:  Needle type: Tuohy  Needle gauge: 17 G Needle length: 9 cm Needle insertion depth: 7 cm Catheter type: closed end flexible Catheter size: 19 Gauge Catheter at skin depth: 13 cm Test dose: negative  Assessment Sensory level: T8 Events: blood not aspirated, injection not painful, no injection resistance, negative IV test and no paresthesia  Additional Notes Patient identified. Risks/Benefits/Options discussed with patient including but not limited to bleeding, infection, nerve damage, paralysis, failed block, incomplete pain control, headache, blood pressure changes, nausea, vomiting, reactions to medication both or allergic, itching and postpartum back pain. Confirmed with bedside nurse the patient's most recent platelet count. Confirmed with patient that they are not currently taking any anticoagulation, have any bleeding history or any family history of bleeding disorders. Patient expressed understanding and wished to proceed. All questions were answered. Sterile technique was used throughout the entire procedure. Please see nursing notes for vital signs. Test dose was given through epidural catheter and negative prior to continuing to dose epidural or start infusion. Warning signs of high block given to the patient including shortness of breath, tingling/numbness in hands, complete motor block,  or any concerning symptoms with instructions to call for help. Patient was given instructions on fall risk and not to get out of bed. All questions and concerns addressed with instructions to call with any issues or inadequate analgesia.  Reason for block:procedure for pain

## 2018-09-08 NOTE — Anesthesia Preprocedure Evaluation (Signed)
Anesthesia Evaluation  Patient identified by MRN, date of birth, ID band Patient awake    Reviewed: Allergy & Precautions, NPO status , Patient's Chart, lab work & pertinent test results  Airway Mallampati: III  TM Distance: >3 FB Neck ROM: Full    Dental no notable dental hx.    Pulmonary neg pulmonary ROS,    Pulmonary exam normal breath sounds clear to auscultation       Cardiovascular negative cardio ROS Normal cardiovascular exam Rhythm:Regular Rate:Normal     Neuro/Psych negative neurological ROS  negative psych ROS   GI/Hepatic negative GI ROS, Neg liver ROS,   Endo/Other  Morbid obesity  Renal/GU negative Renal ROS  negative genitourinary   Musculoskeletal negative musculoskeletal ROS (+)   Abdominal   Peds  Hematology  (+) Blood dyscrasia, anemia ,   Anesthesia Other Findings   Reproductive/Obstetrics (+) Pregnancy                             Anesthesia Physical Anesthesia Plan  ASA: III  Anesthesia Plan: Epidural   Post-op Pain Management:    Induction:   PONV Risk Score and Plan: Treatment may vary due to age or medical condition  Airway Management Planned: Natural Airway  Additional Equipment:   Intra-op Plan:   Post-operative Plan:   Informed Consent: I have reviewed the patients History and Physical, chart, labs and discussed the procedure including the risks, benefits and alternatives for the proposed anesthesia with the patient or authorized representative who has indicated his/her understanding and acceptance.       Plan Discussed with: Anesthesiologist  Anesthesia Plan Comments: (Patient identified. Risks, benefits, options discussed with patient including but not limited to bleeding, infection, nerve damage, paralysis, failed block, incomplete pain control, headache, blood pressure changes, nausea, vomiting, reactions to medication, itching, and post  partum back pain. Confirmed with bedside nurse the patient's most recent platelet count. Confirmed with the patient that they are not taking any anticoagulation, have any bleeding history or any family history of bleeding disorders. Patient expressed understanding and wishes to proceed. All questions were answered. )        Anesthesia Quick Evaluation  

## 2018-09-08 NOTE — H&P (Signed)
28 y.o. [id number] @ 6541w2d presents for elective induction of labor secondary to history of IUFD.  Otherwise has good fetal movement and no bleeding.  Pregnancy c/b:  1. History of stillbirth: at 25 weeks in 2014.  Presented to [facility] with contractions and was found to have a fetal demise. Placental pathology showed acute chorioamnionitis and funisitis. 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 Had normal [facility] labs following IUFD.     Past Medical History:  Diagnosis Date  . Heart murmur   . Infection    UTI  . Preterm labor   . SVD (spontaneous vaginal delivery)    x 1 - Stillbirth    Past Surgical History:  Procedure Laterality Date  . BREAST LUMPECTOMY Left    fibroid adenoma removed  . DILATION AND CURETTAGE OF UTERUS N/A 02/10/2016   Procedure: SUCTION DILATATION AND CURETTAGE;  Surgeon: Hermina StaggersMichael L Ervin, MD;  Location: [facility];  Service: Gynecology;  Laterality: N/A;  . INDUCED ABORTION      OB History  Gravida Para Term Preterm AB Living  6 1 0 1 4 0  SAB TAB Ectopic Multiple Live Births  2 2 0 0      # Outcome Date GA Lbr Len/2nd Weight Sex Delivery Anes PTL Lv  6 Current           5 SAB 2016          4 Preterm 05/17/13 3071w3d 21:25 / 00:16 921 g M Vag-Spont EPI  FD  3 TAB 2009          2 TAB 2008          1 SAB             Social History   Socioeconomic History  . Marital status: Single    Spouse name: Not on file  . Number of children: Not on file  . Years of education: Not on file  . Highest education level: Not on file  Occupational History  . Not on file  Social Needs  . Financial resource strain: Not on file  . Food insecurity:    Worry: Not on file    Inability: Not on file  . Transportation needs:    Medical: Not on file    Non-medical: Not on file  Tobacco Use  . Smoking status: Never Smoker  . Smokeless tobacco: Never Used  Substance and Sexual Activity  . Alcohol use: No  . Drug use: Not Currently   Types: Marijuana    Comment: last early Aug  . Sexual activity: Yes    Birth control/protection: None   Patient has no known allergies.    Prenatal Transfer Tool  Maternal Diabetes: No Genetic Screening: Normal first trimester screen Maternal Ultrasounds/Referrals: Normal Fetal Ultrasounds or other Referrals:  Referred to Materal Fetal Medicine  for h/o IUFD Maternal Substance Abuse:  Yes:  Type: Marijuana--quit in first trimester Significant Maternal Medications:  None Significant Maternal Lab Results: Lab values include: Group B Strep negative  ABO, Rh: --/--/A NEG, A NEG Performed at [facility], [address] [place], [place] [address]  [phone](04/13 0015) Antibody: NEG (04/13 0015) Rubella: Immune (08/27 0000) RPR: Nonreactive (08/27 0000)  HBsAg: Negative (08/27 0000)  HIV: Non-reactive (08/27 0000)  GBS: Negative (03/20 0000)     Vitals:   09/08/18 0600 09/08/18 0700  BP: 119/79 115/78  Pulse: 64 65  Resp: 16 16  Temp:  General:  NAD Abdomen:  soft, gravid, EFW 7.5-8# Ex:  1+ edema SVE:  3/70/-3 FHTs:  120s, moderate variability, + accels, Cat 1 Toco:  q5-8 min  Growth [place] 3/19:  EFW 6lb 4oz (71%)  A/P   28 y.o. [id number] [redacted]w[redacted]d presents for IOL for S/p cytotec x 2 Will start pitocin Epidural upon request  FSR/ vtx/ GBS neg  [facility] GEFFEL Ashaun Gaughan

## 2018-09-09 ENCOUNTER — Encounter (HOSPITAL_COMMUNITY)
Admission: AD | Disposition: A | Discharge status: Home / Self Care | Payer: Self-pay | Source: Home / Self Care | Attending: Obstetrics

## 2018-09-09 ENCOUNTER — Encounter (HOSPITAL_COMMUNITY): Payer: Self-pay | Admitting: Obstetrics

## 2018-09-09 LAB — CBC
HCT: 29.4 % — ABNORMAL LOW (ref 36.0–46.0)
Hemoglobin: 9.6 g/dL — ABNORMAL LOW (ref 12.0–15.0)
MCH: 27.8 pg (ref 26.0–34.0)
MCHC: 32.7 g/dL (ref 30.0–36.0)
MCV: 85.2 fL (ref 80.0–100.0)
Platelets: 223 10*3/uL (ref 150–400)
RBC: 3.45 MIL/uL — ABNORMAL LOW (ref 3.87–5.11)
RDW: 16.7 % — ABNORMAL HIGH (ref 11.5–15.5)
WBC: 14.7 10*3/uL — ABNORMAL HIGH (ref 4.0–10.5)
nRBC: 0 % (ref 0.0–0.2)

## 2018-09-09 SURGERY — Surgical Case
Anesthesia: Epidural

## 2018-09-09 MED ORDER — KETOROLAC TROMETHAMINE 30 MG/ML IJ SOLN
30.0000 mg | Freq: Four times a day (QID) | INTRAMUSCULAR | Status: AC | PRN
  Administered 2018-09-09: 30 mg via INTRAVENOUS

## 2018-09-09 MED ORDER — SODIUM CHLORIDE 0.9 % IV SOLN
INTRAVENOUS | Status: DC | PRN
  Administered 2018-09-09: 40 [IU] via INTRAVENOUS

## 2018-09-09 MED ORDER — OXYTOCIN 40 UNITS IN NORMAL SALINE INFUSION - SIMPLE MED: 2.5000 [IU]/h | INTRAVENOUS | Status: AC

## 2018-09-09 MED ORDER — KETOROLAC TROMETHAMINE 30 MG/ML IJ SOLN
30.0000 mg | Freq: Four times a day (QID) | INTRAMUSCULAR | Status: DC
  Administered 2018-09-09: 30 mg via INTRAVENOUS
  Filled 2018-09-09: qty 1

## 2018-09-09 MED ORDER — NALBUPHINE HCL 10 MG/ML IJ SOLN
5.0000 mg | INTRAMUSCULAR | Status: DC | PRN
  Administered 2018-09-09: 5 mg via INTRAVENOUS
  Filled 2018-09-09: qty 1

## 2018-09-09 MED ORDER — NALBUPHINE HCL 10 MG/ML IJ SOLN: 5.0000 mg | Freq: Once | INTRAMUSCULAR | Status: DC | PRN

## 2018-09-09 MED ORDER — ONDANSETRON HCL 4 MG/2ML IJ SOLN: 4.0000 mg | Freq: Once | INTRAMUSCULAR | Status: DC | PRN

## 2018-09-09 MED ORDER — COCONUT OIL OIL: 1.0000 "application " | TOPICAL_OIL | Status: DC | PRN

## 2018-09-09 MED ORDER — SENNOSIDES-DOCUSATE SODIUM 8.6-50 MG PO TABS
2.0000 | ORAL_TABLET | ORAL | Status: DC
  Administered 2018-09-10 – 2018-09-11 (×2): 2 via ORAL
  Filled 2018-09-09 (×2): qty 2

## 2018-09-09 MED ORDER — LACTATED RINGERS IV SOLN
INTRAVENOUS | Status: DC
  Administered 2018-09-09: 11:00:00 via INTRAVENOUS

## 2018-09-09 MED ORDER — RHO D IMMUNE GLOBULIN 1500 UNIT/2ML IJ SOSY
300.0000 ug | PREFILLED_SYRINGE | Freq: Once | INTRAMUSCULAR | Status: AC
  Administered 2018-09-09: 300 ug via INTRAVENOUS
  Filled 2018-09-09: qty 2

## 2018-09-09 MED ORDER — SODIUM CHLORIDE 0.9% FLUSH: 3.0000 mL | INTRAVENOUS | Status: DC | PRN

## 2018-09-09 MED ORDER — SODIUM CHLORIDE 0.9 % IR SOLN
Status: DC | PRN
  Administered 2018-09-09: 1

## 2018-09-09 MED ORDER — DEXAMETHASONE SODIUM PHOSPHATE 10 MG/ML IJ SOLN
INTRAMUSCULAR | Status: AC
  Filled 2018-09-09: qty 1

## 2018-09-09 MED ORDER — SIMETHICONE 80 MG PO CHEW: 80.0000 mg | CHEWABLE_TABLET | ORAL | Status: DC | PRN

## 2018-09-09 MED ORDER — IBUPROFEN 800 MG PO TABS
800.0000 mg | ORAL_TABLET | Freq: Four times a day (QID) | ORAL | Status: DC
  Administered 2018-09-09 – 2018-09-11 (×6): 800 mg via ORAL
  Filled 2018-09-09 (×5): qty 1

## 2018-09-09 MED ORDER — ONDANSETRON HCL 4 MG/2ML IJ SOLN
INTRAMUSCULAR | Status: DC | PRN
  Administered 2018-09-09: 4 mg via INTRAVENOUS

## 2018-09-09 MED ORDER — ACETAMINOPHEN 160 MG/5ML PO SOLN: 325.0000 mg | ORAL | Status: DC | PRN

## 2018-09-09 MED ORDER — SIMETHICONE 80 MG PO CHEW
80.0000 mg | CHEWABLE_TABLET | ORAL | Status: DC
  Administered 2018-09-10 – 2018-09-11 (×2): 80 mg via ORAL
  Filled 2018-09-09 (×2): qty 1

## 2018-09-09 MED ORDER — MORPHINE SULFATE (PF) 0.5 MG/ML IJ SOLN
INTRAMUSCULAR | Status: DC | PRN
  Administered 2018-09-09: 3 mg via EPIDURAL

## 2018-09-09 MED ORDER — SIMETHICONE 80 MG PO CHEW
80.0000 mg | CHEWABLE_TABLET | Freq: Three times a day (TID) | ORAL | Status: DC
  Administered 2018-09-09 – 2018-09-10 (×6): 80 mg via ORAL
  Filled 2018-09-09 (×6): qty 1

## 2018-09-09 MED ORDER — KETOROLAC TROMETHAMINE 30 MG/ML IJ SOLN
INTRAMUSCULAR | Status: AC
  Filled 2018-09-09: qty 1

## 2018-09-09 MED ORDER — NALOXONE HCL 0.4 MG/ML IJ SOLN: 0.4000 mg | INTRAMUSCULAR | Status: DC | PRN

## 2018-09-09 MED ORDER — OXYCODONE HCL 5 MG PO TABS: 5.0000 mg | ORAL_TABLET | Freq: Once | ORAL | Status: DC | PRN

## 2018-09-09 MED ORDER — SCOPOLAMINE 1 MG/3DAYS TD PT72
1.0000 | MEDICATED_PATCH | Freq: Once | TRANSDERMAL | Status: DC
  Filled 2018-09-09: qty 1

## 2018-09-09 MED ORDER — SODIUM BICARBONATE 8.4 % IV SOLN: INTRAVENOUS | Status: DC | PRN

## 2018-09-09 MED ORDER — NALOXONE HCL 4 MG/10ML IJ SOLN
1.0000 ug/kg/h | INTRAVENOUS | Status: DC | PRN
  Filled 2018-09-09: qty 5

## 2018-09-09 MED ORDER — FENTANYL CITRATE (PF) 100 MCG/2ML IJ SOLN: 25.0000 ug | INTRAMUSCULAR | Status: DC | PRN

## 2018-09-09 MED ORDER — DIPHENHYDRAMINE HCL 25 MG PO CAPS: 25.0000 mg | ORAL_CAPSULE | ORAL | Status: DC | PRN

## 2018-09-09 MED ORDER — PRENATAL MULTIVITAMIN CH
1.0000 | ORAL_TABLET | Freq: Every day | ORAL | Status: DC
  Administered 2018-09-09 – 2018-09-10 (×2): 1 via ORAL
  Filled 2018-09-09 (×3): qty 1

## 2018-09-09 MED ORDER — SODIUM CHLORIDE 0.9 % IV SOLN
INTRAVENOUS | Status: DC | PRN
  Administered 2018-09-09: 01:00:00 via INTRAVENOUS

## 2018-09-09 MED ORDER — MEPERIDINE HCL 25 MG/ML IJ SOLN: 6.2500 mg | INTRAMUSCULAR | Status: DC | PRN

## 2018-09-09 MED ORDER — OXYCODONE HCL 5 MG PO TABS: 5.0000 mg | ORAL_TABLET | ORAL | Status: DC | PRN

## 2018-09-09 MED ORDER — ONDANSETRON HCL 4 MG/2ML IJ SOLN
INTRAMUSCULAR | Status: AC
  Filled 2018-09-09: qty 2

## 2018-09-09 MED ORDER — KETOROLAC TROMETHAMINE 30 MG/ML IJ SOLN: 30.0000 mg | Freq: Four times a day (QID) | INTRAMUSCULAR | Status: AC | PRN

## 2018-09-09 MED ORDER — WITCH HAZEL-GLYCERIN EX PADS: 1.0000 "application " | MEDICATED_PAD | CUTANEOUS | Status: DC | PRN

## 2018-09-09 MED ORDER — CEFAZOLIN SODIUM-DEXTROSE 2-4 GM/100ML-% IV SOLN
INTRAVENOUS | Status: AC
  Filled 2018-09-09: qty 100

## 2018-09-09 MED ORDER — NALBUPHINE HCL 10 MG/ML IJ SOLN: 5.0000 mg | INTRAMUSCULAR | Status: DC | PRN

## 2018-09-09 MED ORDER — DEXAMETHASONE SODIUM PHOSPHATE 10 MG/ML IJ SOLN
INTRAMUSCULAR | Status: DC | PRN
  Administered 2018-09-09: 10 mg via INTRAVENOUS

## 2018-09-09 MED ORDER — MORPHINE SULFATE (PF) 0.5 MG/ML IJ SOLN
INTRAMUSCULAR | Status: AC
  Filled 2018-09-09: qty 10

## 2018-09-09 MED ORDER — ACETAMINOPHEN 500 MG PO TABS
1000.0000 mg | ORAL_TABLET | Freq: Four times a day (QID) | ORAL | Status: DC
  Administered 2018-09-09 – 2018-09-11 (×7): 1000 mg via ORAL
  Filled 2018-09-09 (×7): qty 2

## 2018-09-09 MED ORDER — DIPHENHYDRAMINE HCL 25 MG PO CAPS: 25.0000 mg | ORAL_CAPSULE | Freq: Four times a day (QID) | ORAL | Status: DC | PRN

## 2018-09-09 MED ORDER — ACETAMINOPHEN 325 MG PO TABS: 325.0000 mg | ORAL_TABLET | ORAL | Status: DC | PRN

## 2018-09-09 MED ORDER — ONDANSETRON HCL 4 MG/2ML IJ SOLN: 4.0000 mg | Freq: Three times a day (TID) | INTRAMUSCULAR | Status: DC | PRN

## 2018-09-09 MED ORDER — LIDOCAINE-EPINEPHRINE (PF) 2 %-1:200000 IJ SOLN
INTRAMUSCULAR | Status: AC
  Filled 2018-09-09: qty 10

## 2018-09-09 MED ORDER — OXYTOCIN 40 UNITS IN NORMAL SALINE INFUSION - SIMPLE MED
INTRAVENOUS | Status: AC
  Filled 2018-09-09: qty 1000

## 2018-09-09 MED ORDER — IBUPROFEN 800 MG PO TABS
800.0000 mg | ORAL_TABLET | Freq: Four times a day (QID) | ORAL | Status: DC
  Filled 2018-09-09: qty 1

## 2018-09-09 MED ORDER — DIPHENHYDRAMINE HCL 50 MG/ML IJ SOLN: 12.5000 mg | INTRAMUSCULAR | Status: DC | PRN

## 2018-09-09 MED ORDER — MENTHOL 3 MG MT LOZG: 1.0000 | LOZENGE | OROMUCOSAL | Status: DC | PRN

## 2018-09-09 MED ORDER — OXYCODONE HCL 5 MG/5ML PO SOLN: 5.0000 mg | Freq: Once | ORAL | Status: DC | PRN

## 2018-09-09 MED ORDER — DIBUCAINE (PERIANAL) 1 % EX OINT: 1.0000 "application " | TOPICAL_OINTMENT | CUTANEOUS | Status: DC | PRN

## 2018-09-09 SURGICAL SUPPLY — 44 items
APL SKNCLS STERI-STRIP NONHPOA (GAUZE/BANDAGES/DRESSINGS) ×1
BENZOIN TINCTURE PRP APPL 2/3 (GAUZE/BANDAGES/DRESSINGS) ×2 IMPLANT
CHLORAPREP W/TINT 26ML (MISCELLANEOUS) ×2 IMPLANT
CLAMP CORD UMBIL (MISCELLANEOUS) IMPLANT
CLOSURE STERI STRIP 1/2 X4 (GAUZE/BANDAGES/DRESSINGS) ×1 IMPLANT
CLOTH BEACON ORANGE TIMEOUT ST (SAFETY) ×2 IMPLANT
DRSG OPSITE POSTOP 4X10 (GAUZE/BANDAGES/DRESSINGS) ×2 IMPLANT
ELECT REM PT RETURN 9FT ADLT (ELECTROSURGICAL) ×2
ELECTRODE REM PT RTRN 9FT ADLT (ELECTROSURGICAL) ×1 IMPLANT
EXTRACTOR VACUUM KIWI (MISCELLANEOUS) IMPLANT
GAUZE SPONGE 4X4 12PLY STRL LF (GAUZE/BANDAGES/DRESSINGS) ×2 IMPLANT
GLOVE BIOGEL PI IND STRL 6.5 (GLOVE) ×1 IMPLANT
GLOVE BIOGEL PI IND STRL 7.0 (GLOVE) ×1 IMPLANT
GLOVE BIOGEL PI INDICATOR 6.5 (GLOVE) ×1
GLOVE BIOGEL PI INDICATOR 7.0 (GLOVE) ×1
GLOVE ECLIPSE 6.0 STRL STRAW (GLOVE) ×2 IMPLANT
GOWN STRL REUS W/TWL LRG LVL3 (GOWN DISPOSABLE) ×4 IMPLANT
KIT ABG SYR 3ML LUER SLIP (SYRINGE) IMPLANT
NDL HYPO 25X5/8 SAFETYGLIDE (NEEDLE) IMPLANT
NEEDLE HYPO 25X5/8 SAFETYGLIDE (NEEDLE) IMPLANT
NS IRRIG 1000ML POUR BTL (IV SOLUTION) ×2 IMPLANT
PACK C SECTION WH (CUSTOM PROCEDURE TRAY) ×2 IMPLANT
PAD ABD 7.5X8 STRL (GAUZE/BANDAGES/DRESSINGS) ×1 IMPLANT
PAD OB MATERNITY 4.3X12.25 (PERSONAL CARE ITEMS) ×2 IMPLANT
PENCIL SMOKE EVAC W/HOLSTER (ELECTROSURGICAL) ×2 IMPLANT
RETRACTOR TRAXI PANNICULUS (MISCELLANEOUS) IMPLANT
RTRCTR C-SECT PINK 25CM LRG (MISCELLANEOUS) ×2 IMPLANT
SPONGE LAP 18X18 RF (DISPOSABLE) ×2 IMPLANT
STRIP CLOSURE SKIN 1/2X4 (GAUZE/BANDAGES/DRESSINGS) ×2 IMPLANT
SUT MNCRL 0 VIOLET CTX 36 (SUTURE) ×2 IMPLANT
SUT MNCRL AB 3-0 PS2 27 (SUTURE) ×2 IMPLANT
SUT MON AB 2-0 CT1 27 (SUTURE) ×2 IMPLANT
SUT MONOCRYL 0 CTX 36 (SUTURE) ×2
SUT PLAIN 0 NONE (SUTURE) IMPLANT
SUT PLAIN 2 0 (SUTURE) ×2
SUT PLAIN ABS 2-0 CT1 27XMFL (SUTURE) ×1 IMPLANT
SUT VIC AB 0 CTX 36 (SUTURE) ×4
SUT VIC AB 0 CTX36XBRD ANBCTRL (SUTURE) ×2 IMPLANT
SUT VIC AB 2-0 CT1 27 (SUTURE) ×2
SUT VIC AB 2-0 CT1 TAPERPNT 27 (SUTURE) ×1 IMPLANT
TOWEL OR 17X24 6PK STRL BLUE (TOWEL DISPOSABLE) ×2 IMPLANT
TRAXI PANNICULUS RETRACTOR (MISCELLANEOUS) ×1
TRAY FOLEY W/BAG SLVR 14FR LF (SET/KITS/TRAYS/PACK) ×2 IMPLANT
WATER STERILE IRR 1000ML POUR (IV SOLUTION) ×2 IMPLANT

## 2018-09-09 NOTE — Lactation Note (Signed)
This note was copied from a baby's chart. Lactation Consultation Note  Patient Name: Kristin Medina [id number] Date: 09/09/2018 Reason for consult: Initial assessment;Term;Primapara;1st time breastfeeding  P1 mother whose infant is now 12 hours old.  Baby was asleep in father's arms when I arrived.  Discussed basic breast feeding concepts with mother.  Encouraged her to feed 8-12 times/24 hours or sooner if baby shows feeding cues.  Reviewed feeding cues.  Taught hand expression and mother was able to hand express drops of colostrum from both breasts.  Colostrum container provided and milk storage times reviewed.  Finger feeding demonstrated.    Mother's breasts are soft and non tender and nipples are everted and intact.  Encouraged hand expression before/after feedings to help encourage milk supply.  Mom made aware of O/P services, breastfeeding support groups, community resources, and our phone # for post-discharge questions.   Mother will work from home after maternity leave and already has a DEBP for home use.  Encouraged her to call her RN/LC to observe and assist with latching.  Father present.    Maternal Data Formula Feeding for Exclusion: No Has patient been taught Hand Expression?: Yes Does the patient have breastfeeding experience prior to this delivery?: No  Feeding Feeding Type: Breast Fed  LATCH Score                   Interventions    Lactation Tools Discussed/Used     Consult Status Consult Status: Follow-up Date: 09/10/18 Follow-up type: In-patient    Kristin Medina 09/09/2018, 10:02 AM

## 2018-09-09 NOTE — Progress Notes (Signed)
Patient has pushed well for 2+ hours.  She has a narrow pubic arch and has made no descent from zero station.  There is now moderate caput and the majority of the fetal head remains behind the pubic bone.  In addition, she is starting to have deeper variable decelerations with contractions.  At this time, I recommend proceeding to the OR for primary cesarean delivery for arrest of descent.   We discussed the risks to cesarean section to include infection, bleeding, damage to surrounding structures (including but not limited to bowel, bladder, tubes, ovaries, nerves, vessels, baby), need for blood transfusion, venous thromboembolism, need for additional procedures.   All questions answered, consent signed.

## 2018-09-09 NOTE — Consult Note (Signed)
Neonatology Note:   Attendance at C-section:    I was asked by Dr. Clark to attend this C/S at term due to FTP. The mother is a G6P0140, GBS neg with good prenatal care complicated by h/o IUFD at term and first trimester THC use . ROM 11 hours before delivery, fluid clear. Infant vigorous with good spontaneous cry and tone. +60 sec DCC.  Needed only minimal bulb suctioning. Ap 8/9. Lungs clear to ausc in DR. Father and mother updated.  To CN to care of Pediatrician.  Deedee Lybarger C. Albertine Lafoy, MD Neonatologist 09/09/2018, 12:56 AM  

## 2018-09-09 NOTE — Progress Notes (Signed)
POD#0 Pt without complaints. Lochia wnl. Wants circ . Baby has not voided yet VSSAF IMP/ Doing well Plan/Routine care

## 2018-09-09 NOTE — Op Note (Signed)
Cesarean Section Procedure Note  Pre-operative Diagnosis: 1. Intrauterine pregnancy at [redacted]w[redacted]d  2. Arrest of descent  Post-operative Diagnosis: same as above  Surgeon: Marlow Baars, MD  Procedure: Primary low transverse cesarean section   Anesthesia: Epidural anesthesia  Estimated Blood Loss: 581 mL         Drains: Foley catheter         Specimens: none              Complications:  None; patient tolerated the procedure well.         Disposition: PACU - hemodynamically stable.  Findings:  Normal uterus, tubes and ovaries bilaterally.  Viable female infant, 3540g (7lb 14oz) Apgars 8, 9.    Procedure Details   After epidural anesthesia was found to adequate, the patient was placed in the dorsal supine position with a leftward tilt, prepped and draped in the usual sterile manner. A Pfannenstiel incision was made and carried down through the subcutaneous tissue to the fascia.  The fascia was incised in the midline and the fascial incision was extended laterally with Mayo scissors. The superior aspect of the fascial incision was grasped with two Kocher clamps, tented up and the rectus muscles dissected off sharply. The rectus was then dissected off with blunt dissection and Mayo scissors inferiorly. The rectus muscles were separated in the midline. The abdominal peritoneum was identified, tented up, entered bluntly, and the incision was extended superiorly and inferiorly with good visualization of the bladder. The Alexis retractor was deployed. The vesicouterine peritoneum was identified, tented up, entered sharply, and the bladder flap was created digitally. A scalpel was then used to make a low transverse incision on the uterus which was extended in the cephalad-caudad direction with blunt dissection. The fluid was clear. The fetal vertex was identified, elevated out of the pelvis and brought to the hysterotomy.  The head was delivered easily followed by the shoulders and body.  After a 60 second  delay per protocol, the cord was clamped and cut and the infant was passed to the waiting neonatologist.  The placenta was then delivered spontaneously, intact and appear normal, the uterus was cleared of all clot and debris   The hysterotomy was repaired with #0 Monocryl in running locked fashion.  There was an small extension at the right corner that was incorporated into the first layer of closure. A second imbricating layer of #0 Monocryl was placed.  Two figure of eight sutures were placed at the lower midportion of the hysterotomy to reinforce a thin portion of the lower uterine segment. An additional figure of eight suture was placed at the right corner.  Excellent hemostasis was noted.  The Alexis retractor was removed from the abdomen. The peritoneum was examined and all vessels noted to be hemostatic. The abdominal cavity was cleared of all clot and debris.  The peritoneum was closed with 2-0 vicryl in a running fashion.  The rectus muscles were then closed with 2-0 Vicryl. The fascia and rectus muscles were inspected and were hemostatic. The fascia was closed with 0 Vicryl in a running fashion. The subcutaneous layer was irrigated and all bleeders cauterized. The subcutaneous layer was closed with interrupted plain gut. The skin was closed with 3-0 monocryl in a subcuticular fashion. The incision was dressed with benzoine, steri strips and honeycomb dressing. All sponge lap and needle counts were correct x3. Patient tolerated the procedure well and recovered in stable condition following the procedure.

## 2018-09-09 NOTE — Anesthesia Postprocedure Evaluation (Signed)
Anesthesia Post Note  Patient: Kristin Medina  Procedure(s) Performed: CESAREAN SECTION (N/A )     Patient location during evaluation: Mother Baby Anesthesia Type: Epidural Level of consciousness: awake and alert Pain management: pain level controlled Vital Signs Assessment: post-procedure vital signs reviewed and stable Respiratory status: spontaneous breathing, nonlabored ventilation and respiratory function stable Cardiovascular status: stable Postop Assessment: no headache, no backache and epidural receding Anesthetic complications: no    Last Vitals:  Vitals:   09/09/18 1450 09/09/18 1815  BP: 113/69 120/64  Pulse: 70 77  Resp: 18 16  Temp: (!) 36.4 C 36.8 C  SpO2: 98%     Last Pain:  Vitals:   09/09/18 1910  TempSrc:   PainSc: 0-No pain                 Erion Weightman

## 2018-09-09 NOTE — Transfer of Care (Signed)
Immediate Anesthesia Transfer of Care Note  Patient: Kristin Medina  Procedure(s) Performed: CESAREAN SECTION (N/A )  Patient Location: PACU  Anesthesia Type:Epidural  Level of Consciousness: awake, alert  and oriented  Airway & Oxygen Therapy: Patient Spontanous Breathing  Post-op Assessment: Report given to RN and Post -op Vital signs reviewed and stable  Post vital signs: Reviewed and stable  Last Vitals:  Vitals Value Taken Time  BP    Temp    Pulse 91 09/09/2018  1:37 AM  Resp    SpO2 95 % 09/09/2018  1:37 AM  Vitals shown include unvalidated device data.  Last Pain:  Vitals:   09/08/18 2320  TempSrc: Axillary  PainSc:          Complications: No apparent anesthesia complications

## 2018-09-10 LAB — RH IG WORKUP (INCLUDES ABO/RH)
ABO/RH(D): A NEG
Fetal Screen: NEGATIVE
Gestational Age(Wks): 39.3
Unit division: 0

## 2018-09-10 MED ORDER — FERROUS SULFATE 325 (65 FE) MG PO TABS: 325.0000 mg | ORAL_TABLET | Freq: Every day | ORAL | Status: DC

## 2018-09-10 NOTE — Progress Notes (Signed)
Patient is eating, ambulating, voiding.  Pain control is good.  Appropriate lochia, no complaints.  Vitals:   09/09/18 1450 09/09/18 1815 09/09/18 2158 09/10/18 0546  BP: 113/69 120/64 (!) 132/57 (!) 107/56  Pulse: 70 77 88 98  Resp: 18 16 16 16   Temp: (!) 97.5 F (36.4 C) 98.2 F (36.8 C) 97.9 F (36.6 C) 98.1 F (36.7 C)  TempSrc: Oral Oral Oral Oral  SpO2: 98%  97%   Weight:      Height:        Fundus firm, non-tender Inc: c/d/i Ext: no calf tenderness  Lab Results  Component Value Date   WBC 14.7 (H) 09/09/2018   HGB 9.6 (L) 09/09/2018   HCT 29.4 (L) 09/09/2018   MCV 85.2 09/09/2018   PLT 223 09/09/2018    --/--/A NEG (04/14 1027)  A/P Post op day #1 s/p c/s Anemia- iron daily Doing well, encourage up and moving today. Circ desired, consent obtained  Routine care.  Expect d/c 4/16.    Philip Aspen

## 2018-09-10 NOTE — Clinical Social Work Maternal (Signed)
CLINICAL SOCIAL WORK MATERNAL/CHILD NOTE  Patient Details  Name: Kristin Medina MRN: [id number] Date of Birth: 11/27/1990  Date:  09/10/2018  Clinical Social Worker Initiating Note:  Bernisha Verma Irwin Date/Time: Initiated:  09/10/18/0904     Child's Name:  Kristin Williams Jr.   Biological Parents:  Mother, Father(Kristin Medina and Kristin Williams Sr. DOB: 10/13/1986)   Need for Interpreter:  None   Reason for Referral:  Current Substance Use/Substance Use During Pregnancy (During first trimester)   Address:  [address] [place] [address]    Phone number:  [phone] (home)     Additional phone number:   Household Members/Support Persons (HM/SP):   Household Member/Support Person 1   HM/SP Name Relationship DOB or Age  HM/SP -1 Kristin Williams Sr. FOB 10/13/1986  HM/SP -2        HM/SP -3        HM/SP -4        HM/SP -5        HM/SP -6        HM/SP -7        HM/SP -8          Natural Supports (not living in the home):  Parent(FOB's mom and MOB's mom)   Professional Supports:     Employment: Full-time   Type of Work: [employer]   Education:  Some College   Homebound arranged:    Financial Resources:      Other Resources:      Cultural/Religious Considerations Which May Impact Care:    Strengths:  Ability to meet basic needs , Home prepared for child , Pediatrician chosen   Psychotropic Medications:         Pediatrician:    [place] area  Pediatrician List:   [facility]  [place]    [place]    [place]    [place]    [place]      Pediatrician Fax Number:    Risk Factors/Current Problems:  Substance Use (During first trimester)   Cognitive State:  Able to Concentrate , Alert , Linear Thinking    Mood/Affect:  Bright , Calm , Comfortable , Happy , Interested , Relaxed    CSW Assessment: CSW received consult for history of  substance use during first trimester.  CSW met with MOB to offer support and complete assessment.    MOB propped up in bed, alert and holding sleeping infant. FOB also present and asleep in recliner. CSW introduced self and role and explained reason for consult. MOB pleasant, easy to engage and receptive throughout assessment. MOB reported she currently lives with FOB in [place]. 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 MOB denied having any mental health history but was receptive to PMADs education. CSW provided education regarding the baby blues period vs. perinatal mood disorders, discussed treatment and gave resources for mental health follow up if concerns arise.  CSW recommends self-evaluation during the postpartum time period using the New Mom Checklist from Postpartum Progress and encouraged MOB to contact a medical professional if symptoms are noted at any time.  MOB denied any SI, HI or DV and reported having a good support system consisting of FOB, FOB's mom and MOB's mom.   CSW inquired about MOB's substance use during pregnancy. MOB reported she smoked marijuana up until she   found out she was pregnant and then she quit. MOB stated her last use was in August. CSW informed MOB of Hospital Drug Policy and explained UDS came back negative but that CDS was still pending and that a CPS report would be made, if warranted. MOB denied any questions or concerns regarding policy.   MOB reported having all essential items for infant once discharged. MOB stated infant has both a basinet and crib to sleep in once home. CSW provided review of Sudden Infant Death Syndrome (SIDS) precautions and safe sleeping habits. MOB intends to take infant to [facility] for follow up appointments. MOB denied any further questions or need for resources from CSW at this time.    CSW  Plan/Description:  No Further Intervention Required/No Barriers to Discharge, Sudden Infant Death Syndrome (SIDS) Education, Perinatal Mood and Anxiety Disorder (PMADs) Education, Hospital Drug Screen Policy Information, CSW Will Continue to Monitor Umbilical Cord Tissue Drug Screen Results and Make Report if Warranted    Atia Haupt  Irwin, LCSWA 09/10/2018, 9:59 AM 

## 2018-09-11 ENCOUNTER — Ambulatory Visit: Payer: Self-pay

## 2018-09-11 ENCOUNTER — Encounter (HOSPITAL_COMMUNITY): Payer: Self-pay | Admitting: *Deleted

## 2018-09-11 MED ORDER — OXYCODONE-ACETAMINOPHEN 5-325 MG PO TABS: 1.0000 | ORAL_TABLET | ORAL | 0 refills | Status: DC | PRN

## 2018-09-11 NOTE — Lactation Note (Signed)
This note was copied from a baby's chart. Lactation Consultation Note  Patient Name: Kristin Medina [id number] Date: 09/11/2018 Reason for consult: Follow-up assessment;Infant weight loss;1st time breastfeeding;Term;Other (Comment)(6% weight loss / early D/C / mom denies soreness )  LC reviewed doc flow sheets - baby has been consistently latching 10 - 30 mins and snacks less than 10 mins/ Latch Scores 6-9's , voids and stools QS for age. @ 53 hours Bilicheck - 12.  Mom reports breast feeding is going well and denies soreness/ hearing more swallows and breast are fuller.  Sore nipple and engorgement prevention and tx reviewed. Per mom familiar with engorgement when she had a baby  That was still birth she got engorged. Storage of breast milk reviewed.  LC reviewed resources - Virtual BFSG and the need to sign up if interested, breast feeding help line,  And LC O/P by appt.  LC discussed nutritive vs non - nutritive feeding patterns and the importance of watching the baby for hanging out latched.  Importance of STS feedings until the baby is back to birth weight/ gaining steadily/ and can stay awake for majority of feeding.      Maternal Data Has patient been taught Hand Expression?: Yes  Feeding Feeding Type: Breast Fed  LATCH Score                   Interventions Interventions: Breast feeding basics reviewed  Lactation Tools Discussed/Used WIC Program: No Pump Review: Milk Storage   Consult Status Consult Status: Complete Date: 09/11/18    Kathrin Greathouse 09/11/2018, 10:21 AM

## 2018-09-11 NOTE — Progress Notes (Signed)
  Patient is eating, ambulating, voiding.  Pain control is good.  Vitals:   09/10/18 0546 09/10/18 1433 09/10/18 2213 09/11/18 0523  BP: (!) 107/56 117/61 125/72 119/72  Pulse: 98 79 76 74  Resp: 16 16 16 18   Temp: 98.1 F (36.7 C) 98 F (36.7 C) 98.1 F (36.7 C) 98.3 F (36.8 C)  TempSrc: Oral Oral Oral Oral  SpO2:  98% 99% 99%  Weight:      Height:        lungs:   clear to auscultation cor:    RRR Abdomen:  soft, appropriate tenderness, incisions intact and without erythema or exudate ex:    no cords   Lab Results  Component Value Date   WBC 14.7 (H) 09/09/2018   HGB 9.6 (L) 09/09/2018   HCT 29.4 (L) 09/09/2018   MCV 85.2 09/09/2018   PLT 223 09/09/2018    --/--/A NEG (04/14 0613)/RI  A/P    Post operative day 2.  Routine post op and postpartum care.  Expect d/c today.  Percocet for pain control. Iron.  Baby was RH POS- Rhogam given.  Circ done yesterday.

## 2018-09-11 NOTE — Discharge Summary (Signed)
Obstetric Discharge Summary Reason for Admission: induction of labor Prenatal Procedures: NST Intrapartum Procedures: cesarean: low cervical, transverse Postpartum Procedures: Rho(D) Ig Complications-Operative and Postpartum: none Hemoglobin  Date Value Ref Range Status  09/09/2018 9.6 (L) 12.0 - 15.0 g/dL Final   HCT  Date Value Ref Range Status  09/09/2018 29.4 (L) 36.0 - 46.0 % Final    Discharge Diagnoses: Term Pregnancy-delivered  Discharge Information: Date: 09/11/2018 Activity: pelvic rest Diet: routine Medications: Iron and Percocet Condition: improved Instructions: refer to practice specific booklet Discharge to: home Follow-up Information    Marlow Baars, MD Follow up in 4 week(s).   Specialty:  Obstetrics Why:  may be telehealth if still under restrictions Contact information: [address] [place] [address] [phone]           Newborn Data: Live born female  Birth Weight: 7 lb 14.3 oz (3580 g) APGAR: 8, 9  Newborn Delivery   Birth date/time:  09/09/2018 00:39:00 Delivery type:  C-Section, Low Transverse Trial of labor:  Yes C-section categorization:  Primary     Home with mother.  Loney Laurence 09/11/2018, 8:12 AM

## 2018-10-16 IMAGING — US US OB < 14 WEEKS - US OB TV
1 series · 15 of 28 positions shown · non-contrast
Comparison: None.

CLINICAL DATA: Vaginal bleeding. Estimated gestational age of 7
weeks, 3 days by LMP.

EXAM:
OBSTETRIC <14 WK US AND TRANSVAGINAL OB US
TECHNIQUE: Both transabdominal and transvaginal ultrasound examinations were
performed for complete evaluation of the gestation as well as the
maternal uterus, adnexal regions, and pelvic cul-de-sac.
Transvaginal technique was performed to assess early pregnancy.

[Series 1: us ob < 14 weeks - us ob tv · 93 acquisitions, 15 frames shown]
[im 1/93]
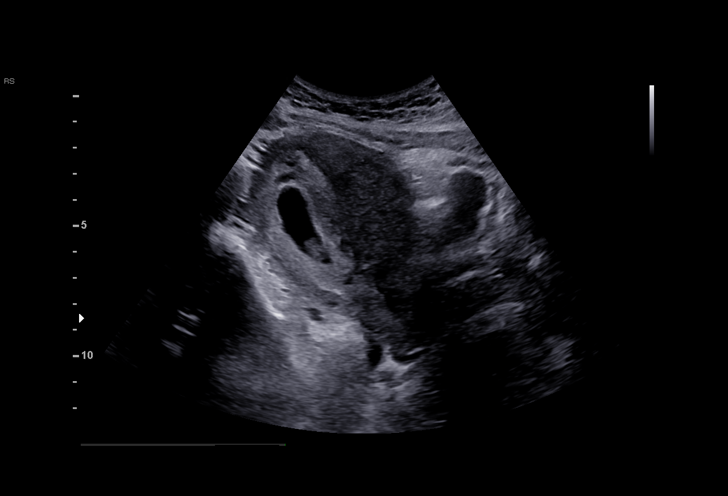
[im 7/93]
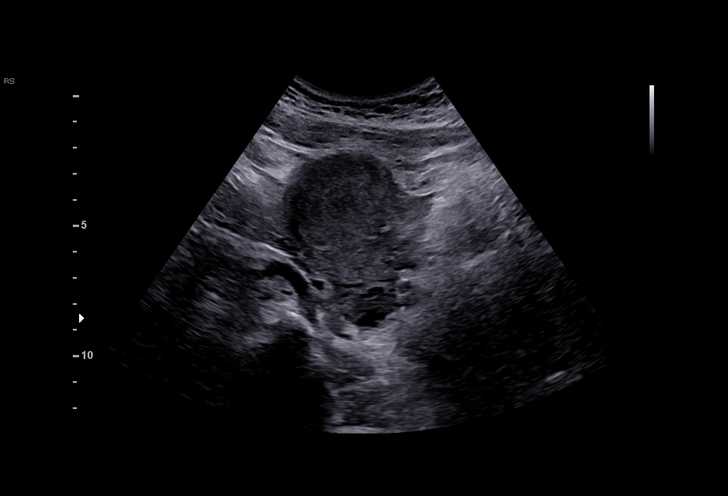
[im 14/93]
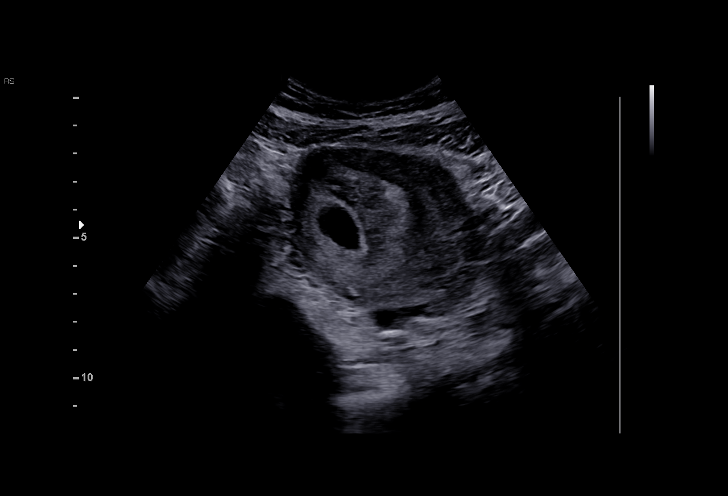
[im 21/93]
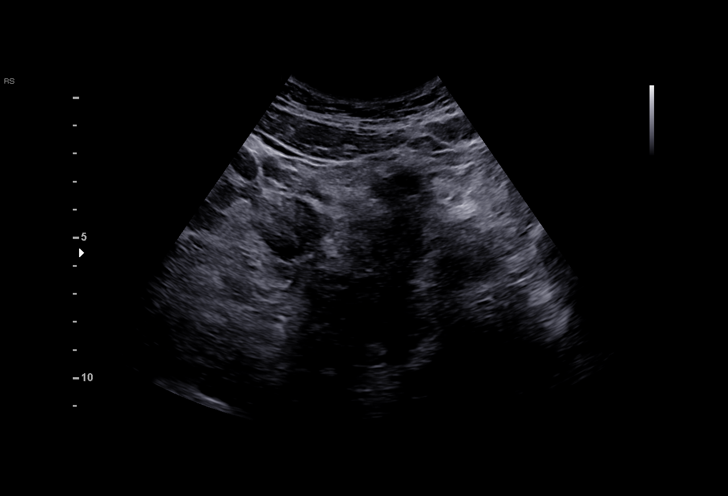
[im 28/93]
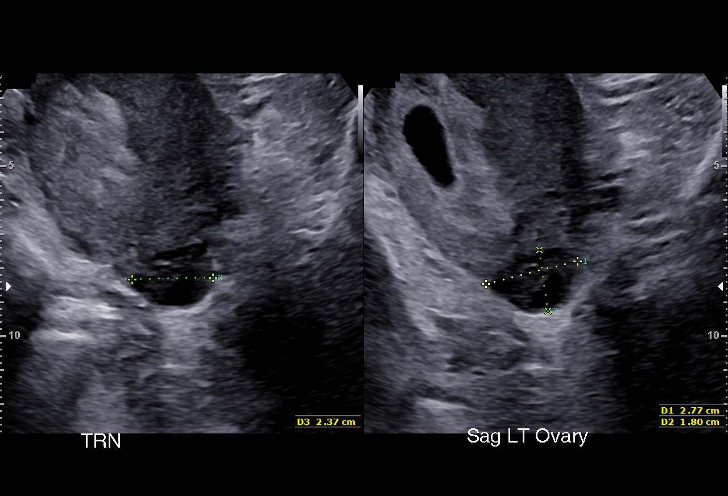
[im 35/93]
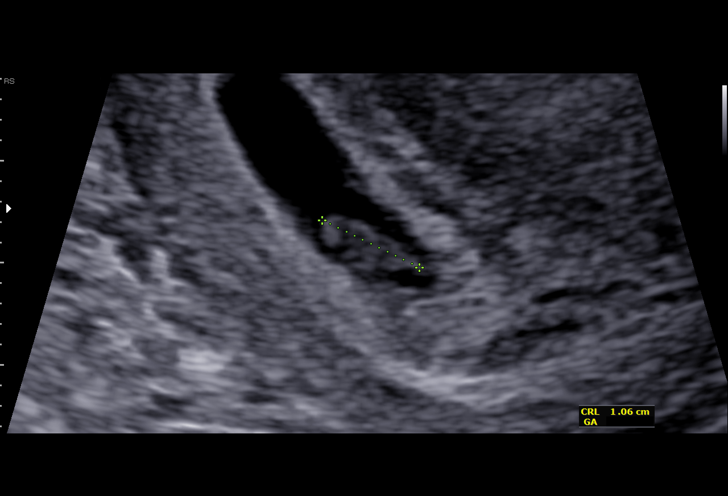
[im 41/93]
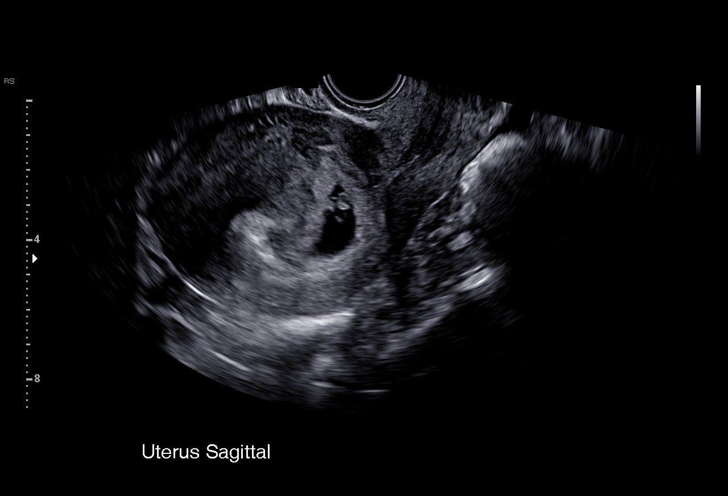
[im 48/93]
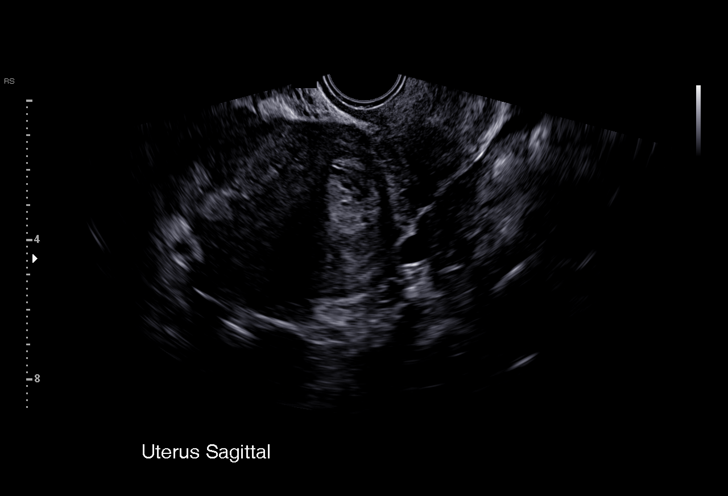
[im 52/93]
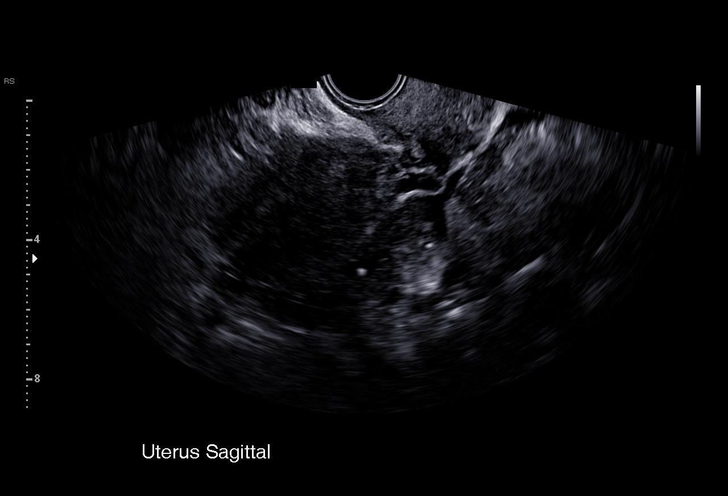
[im 58/93]
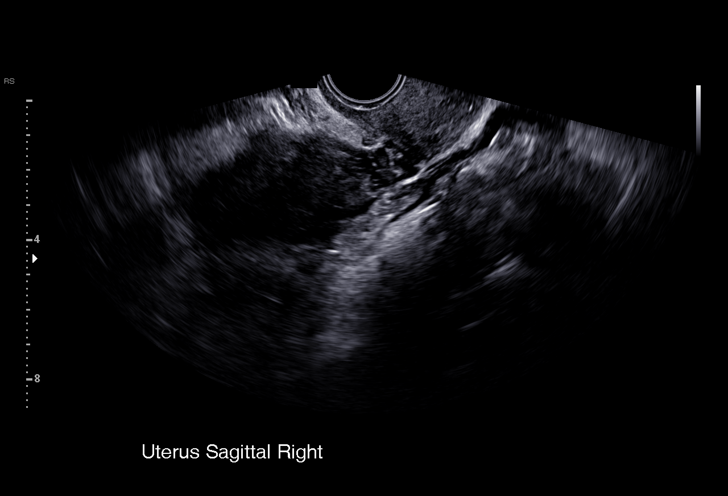
[im 65/93]
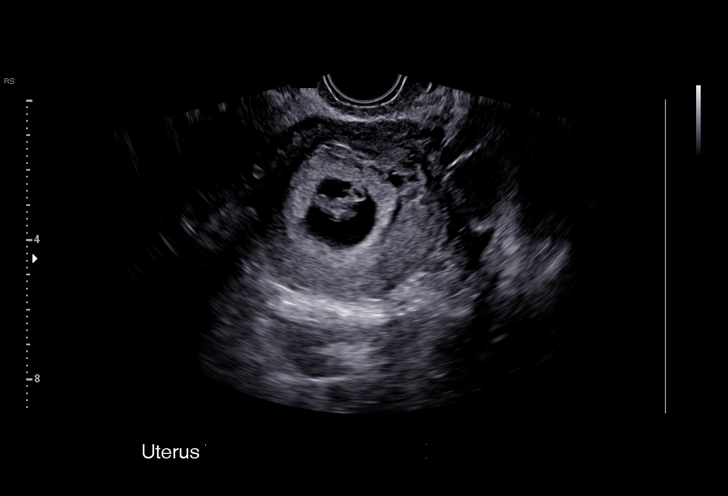
[im 72/93]
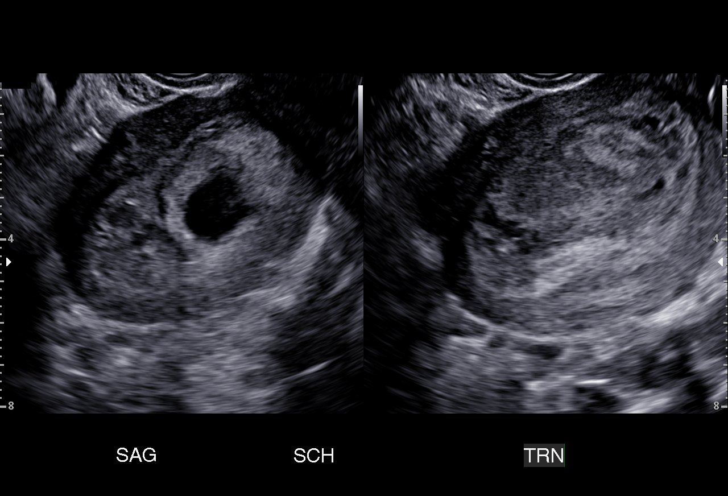
[im 79/93]
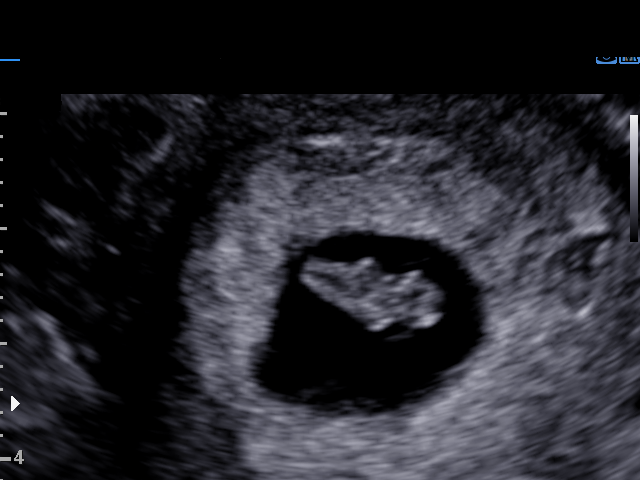
[im 86/93]
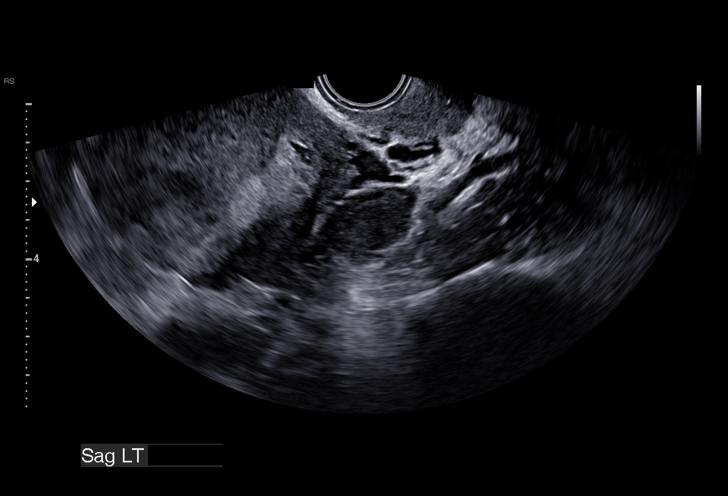
[im 93/93]
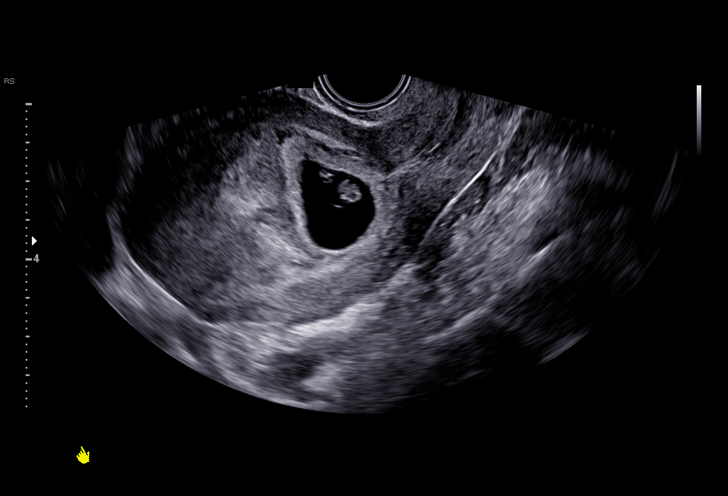

[15 of 28 positions shown; findings below may reference images not displayed]

FINDINGS: Intrauterine gestational sac: Single

Yolk sac:  Visualized.

Embryo:  Visualized.

Cardiac Activity: Visualized.

Heart Rate: 162 bpm

CRL:  12.9 mm   7 w   3 d                  US EDC: 09/13/2018

Subchorionic hemorrhage: Small 2.2 x 1.8 x 2.1 cm subchorionic
hemorrhage.

Maternal uterus/adnexae: Unremarkable.

Trace free fluid in the pelvis, likely physiologic.
IMPRESSION: 1. Single, live intrauterine pregnancy with estimated gestational
age of 7 weeks, 3 days.
2. Small subchorionic hemorrhage.

## 2020-11-11 ENCOUNTER — Other Ambulatory Visit: Payer: Self-pay

## 2020-11-11 ENCOUNTER — Encounter (HOSPITAL_BASED_OUTPATIENT_CLINIC_OR_DEPARTMENT_OTHER): Payer: Self-pay | Admitting: Obstetrics

## 2020-11-11 DIAGNOSIS — O021 Missed abortion: Secondary | ICD-10-CM

## 2020-11-11 HISTORY — DX: Missed abortion: O02.1

## 2020-11-11 NOTE — Progress Notes (Signed)
Spoke w/ via phone for pre-op interview--- Pt Lab needs dos----    no           Lab results------ no COVID test -----patient states asymptomatic no test needed Arrive at ------- 0945 on 11-14-2020 NPO after MN NO Solid Food.  Clear liquids from MN until--- 0845 Med rec completed Medications to take morning of surgery ----- NONE Diabetic medication ----- n/a Patient instructed no nail polish to be worn day of surgery Patient instructed to bring photo id and insurance card day of surgery Patient aware to have Driver (ride ) / caregiver or 24 hours after surgery -- sig other, Mychal Williams Patient Special Instructions ----- n/a Pre-Op special Istructions ----- n/a Patient verbalized understanding of instructions that were given at this phone interview. Patient denies shortness of breath, chest pain, fever, cough at this phone interview.

## 2020-11-13 NOTE — Anesthesia Preprocedure Evaluation (Addendum)
Anesthesia Evaluation  Patient identified by MRN, date of birth, ID band Patient awake    Reviewed: Allergy & Precautions, NPO status , Patient's Chart, lab work & pertinent test results  Airway Mallampati: II  TM Distance: >3 FB Neck ROM: Full    Dental no notable dental hx. (+) Teeth Intact, Dental Advisory Given   Pulmonary neg pulmonary ROS,    Pulmonary exam normal breath sounds clear to auscultation       Cardiovascular Exercise Tolerance: Good Normal cardiovascular exam Rhythm:Regular Rate:Normal     Neuro/Psych negative neurological ROS  negative psych ROS   GI/Hepatic negative GI ROS, Neg liver ROS,   Endo/Other  negative endocrine ROS  Renal/GU negative Renal ROS     Musculoskeletal   Abdominal (+) + obese,   Peds  Hematology   Anesthesia Other Findings   Reproductive/Obstetrics (+) Pregnancy Est 8.5 Wks                            Anesthesia Physical Anesthesia Plan  ASA: 2  Anesthesia Plan: General   Post-op Pain Management:    Induction: Intravenous  PONV Risk Score and Plan: 4 or greater and Treatment may vary due to age or medical condition, Midazolam and Ondansetron  Airway Management Planned: LMA  Additional Equipment: None  Intra-op Plan:   Post-operative Plan:   Informed Consent: I have reviewed the patients History and Physical, chart, labs and discussed the procedure including the risks, benefits and alternatives for the proposed anesthesia with the patient or authorized representative who has indicated his/her understanding and acceptance.     Dental advisory given  Plan Discussed with: CRNA and Anesthesiologist  Anesthesia Plan Comments: (LMA GA)       Anesthesia Quick Evaluation

## 2020-11-14 ENCOUNTER — Encounter (HOSPITAL_BASED_OUTPATIENT_CLINIC_OR_DEPARTMENT_OTHER)
Admission: RE | Disposition: A | Discharge status: Home / Self Care | Payer: Self-pay | Source: Home / Self Care | Attending: Obstetrics

## 2020-11-14 ENCOUNTER — Ambulatory Visit (HOSPITAL_BASED_OUTPATIENT_CLINIC_OR_DEPARTMENT_OTHER)
Admission: RE | Admit: 2020-11-14 | Discharge: 2020-11-14 | Disposition: A | Discharge status: Home / Self Care | Payer: 59 | Attending: Obstetrics | Admitting: Obstetrics

## 2020-11-14 ENCOUNTER — Ambulatory Visit (HOSPITAL_BASED_OUTPATIENT_CLINIC_OR_DEPARTMENT_OTHER): Payer: 59 | Admitting: Anesthesiology

## 2020-11-14 ENCOUNTER — Encounter (HOSPITAL_BASED_OUTPATIENT_CLINIC_OR_DEPARTMENT_OTHER): Payer: Self-pay | Admitting: Obstetrics

## 2020-11-14 ENCOUNTER — Encounter (HOSPITAL_COMMUNITY): Payer: Self-pay | Admitting: Obstetrics and Gynecology

## 2020-11-14 DIAGNOSIS — Z3A08 8 weeks gestation of pregnancy: Secondary | ICD-10-CM | POA: Diagnosis not present

## 2020-11-14 DIAGNOSIS — O021 Missed abortion: Secondary | ICD-10-CM | POA: Diagnosis not present

## 2020-11-14 HISTORY — PX: DILATION AND EVACUATION: SHX1459

## 2020-11-14 SURGERY — DILATION AND EVACUATION, UTERUS
Anesthesia: General

## 2020-11-14 MED ORDER — POVIDONE-IODINE 10 % EX SWAB: 2.0000 "application " | Freq: Once | CUTANEOUS | Status: DC

## 2020-11-14 MED ORDER — PROPOFOL 10 MG/ML IV BOLUS
INTRAVENOUS | Status: DC | PRN
  Administered 2020-11-14: 150 mg via INTRAVENOUS
  Administered 2020-11-14: 50 mg via INTRAVENOUS

## 2020-11-14 MED ORDER — ONDANSETRON HCL 4 MG/2ML IJ SOLN
INTRAMUSCULAR | Status: DC | PRN
  Administered 2020-11-14: 4 mg via INTRAVENOUS

## 2020-11-14 MED ORDER — MIDAZOLAM HCL 5 MG/5ML IJ SOLN
INTRAMUSCULAR | Status: DC | PRN
  Administered 2020-11-14: 2 mg via INTRAVENOUS

## 2020-11-14 MED ORDER — KETOROLAC TROMETHAMINE 30 MG/ML IJ SOLN
INTRAMUSCULAR | Status: DC | PRN
  Administered 2020-11-14: 30 mg via INTRAVENOUS

## 2020-11-14 MED ORDER — ONDANSETRON HCL 4 MG/2ML IJ SOLN
INTRAMUSCULAR | Status: AC
  Filled 2020-11-14: qty 2

## 2020-11-14 MED ORDER — DEXAMETHASONE SODIUM PHOSPHATE 4 MG/ML IJ SOLN
INTRAMUSCULAR | Status: DC | PRN
  Administered 2020-11-14: 5 mg via INTRAVENOUS

## 2020-11-14 MED ORDER — LIDOCAINE HCL 1 % IJ SOLN
INTRAMUSCULAR | Status: DC | PRN
  Administered 2020-11-14: 10 mL

## 2020-11-14 MED ORDER — LACTATED RINGERS IV SOLN: INTRAVENOUS | Status: DC

## 2020-11-14 MED ORDER — FENTANYL CITRATE (PF) 100 MCG/2ML IJ SOLN
INTRAMUSCULAR | Status: DC | PRN
  Administered 2020-11-14: 50 ug via INTRAVENOUS

## 2020-11-14 MED ORDER — MIDAZOLAM HCL 2 MG/2ML IJ SOLN
INTRAMUSCULAR | Status: AC
  Filled 2020-11-14: qty 2

## 2020-11-14 MED ORDER — LIDOCAINE 2% (20 MG/ML) 5 ML SYRINGE
INTRAMUSCULAR | Status: DC | PRN
  Administered 2020-11-14: 100 mg via INTRAVENOUS

## 2020-11-14 MED ORDER — DOXYCYCLINE HYCLATE 100 MG IV SOLR
200.0000 mg | Freq: Once | INTRAVENOUS | Status: AC
  Administered 2020-11-14: 200 mg via INTRAVENOUS
  Filled 2020-11-14: qty 200

## 2020-11-14 MED ORDER — PROPOFOL 10 MG/ML IV BOLUS
INTRAVENOUS | Status: AC
  Filled 2020-11-14: qty 20

## 2020-11-14 MED ORDER — DEXAMETHASONE SODIUM PHOSPHATE 10 MG/ML IJ SOLN
INTRAMUSCULAR | Status: AC
  Filled 2020-11-14: qty 1

## 2020-11-14 MED ORDER — LIDOCAINE HCL (PF) 2 % IJ SOLN
INTRAMUSCULAR | Status: AC
  Filled 2020-11-14: qty 5

## 2020-11-14 MED ORDER — FENTANYL CITRATE (PF) 100 MCG/2ML IJ SOLN
INTRAMUSCULAR | Status: AC
  Filled 2020-11-14: qty 2

## 2020-11-14 SURGICAL SUPPLY — 21 items
CNTNR URN SCR LID CUP LEK RST (MISCELLANEOUS) IMPLANT
CONT SPEC 4OZ STRL OR WHT (MISCELLANEOUS) ×2
DECANTER SPIKE VIAL GLASS SM (MISCELLANEOUS) ×2 IMPLANT
DILATOR CANAL MILEX (MISCELLANEOUS) IMPLANT
GLOVE SURG LTX SZ6 (GLOVE) ×2 IMPLANT
GLOVE SURG UNDER POLY LF SZ6 (GLOVE) ×2 IMPLANT
GOWN STRL REUS W/TWL LRG LVL3 (GOWN DISPOSABLE) ×4 IMPLANT
HOSE CONNECTING 18IN BERKELEY (TUBING) ×2 IMPLANT
KIT BERKELEY 1ST TRI 3/8 NO TR (MISCELLANEOUS) ×2 IMPLANT
KIT BERKELEY 1ST TRIMESTER 3/8 (MISCELLANEOUS) ×3 IMPLANT
KIT TURNOVER CYSTO (KITS) ×2 IMPLANT
NS IRRIG 1000ML POUR BTL (IV SOLUTION) ×2 IMPLANT
PACK VAGINAL MINOR WOMEN LF (CUSTOM PROCEDURE TRAY) ×2 IMPLANT
PAD OB MATERNITY 4.3X12.25 (PERSONAL CARE ITEMS) ×2 IMPLANT
SET BERKELEY SUCTION TUBING (SUCTIONS) ×2 IMPLANT
TOWEL OR 17X26 10 PK STRL BLUE (TOWEL DISPOSABLE) ×2 IMPLANT
UNDERPAD 30X36 HEAVY ABSORB (UNDERPADS AND DIAPERS) ×2 IMPLANT
VACURETTE 10 RIGID CVD (CANNULA) IMPLANT
VACURETTE 7MM CVD STRL WRAP (CANNULA) IMPLANT
VACURETTE 8 RIGID CVD (CANNULA) IMPLANT
VACURETTE 9 RIGID CVD (CANNULA) ×1 IMPLANT

## 2020-11-14 NOTE — Discharge Instructions (Addendum)
  Post Anesthesia Home Care Instructions  Activity: Get plenty of rest for the remainder of the day. A responsible adult should stay with you for 24 hours following the procedure.  For the next 24 hours, DO NOT: -Drive a car -Advertising copywriter -Drink alcoholic beverages -Take any medication unless instructed by your physician -Make any legal decisions or sign important papers.  Meals: Start with liquid foods such as gelatin or soup. Progress to regular foods as tolerated. Avoid greasy, spicy, heavy foods. If nausea and/or vomiting occur, drink only clear liquids until the nausea and/or vomiting subsides. Call your physician if vomiting continues.  Special Instructions/Symptoms: Your throat may feel dry or sore from the anesthesia or the breathing tube placed in your throat during surgery. If this causes discomfort, gargle with warm salt water. The discomfort should disappear within 24 hours.  If you had a scopolamine patch placed behind your ear for the management of post- operative nausea and/or vomiting:  1. The medication in the patch is effective for 72 hours, after which it should be removed.  Wrap patch in a tissue and discard in the trash. Wash hands thoroughly with soap and water. 2. You may remove the patch earlier than 72 hours if you experience unpleasant side effects which may include dry mouth, dizziness or visual disturbances. 3. Avoid touching the patch. Wash your hands with soap and water after contact with the patch.     D & C Home care Instructions:   Personal hygiene:  Used sanitary napkins for vaginal drainage not tampons. Shower or tub bathe the day after your procedure. No douching until bleeding stops. Always wipe from front to back after  Elimination.  Activity: Do not drive or operate any equipment today. The effects of the anesthesia are still present and drowsiness may result. Rest today, not necessarily flat bed rest, just take it easy. You may resume your  normal activity in one to 2 days.  Sexual activity: No intercourse for one week or as indicated by your physician  Diet: Eat a light diet as desired this evening. You may resume a regular diet tomorrow.  Return to work: One to 2 days.  General Expectations of your surgery: Vaginal bleeding should be no heavier than a normal period. Spotting may continue up to 10 days. Mild cramps may continue for a couple of days. You may have a regular period in 2-6 weeks.  Unexpected observations call your doctor if these occur: persistent or heavy bleeding. Severe abdominal cramping or pain. Elevation of temperature greater than 100F.  Call for an appointment in one week.    Patient's Signature_______________________________________________________  Nurse's Signature________________________________________________________    Next dose advil/motin at 6:30pm

## 2020-11-14 NOTE — Anesthesia Postprocedure Evaluation (Signed)
Anesthesia Post Note  Patient: Kristin Medina  Procedure(s) Performed: DILATATION AND EVACUATION CHROMOSOME STUDIES     Patient location during evaluation: PACU Anesthesia Type: General Level of consciousness: awake and alert Pain management: pain level controlled Vital Signs Assessment: post-procedure vital signs reviewed and stable Respiratory status: spontaneous breathing, nonlabored ventilation, respiratory function stable and patient connected to nasal cannula oxygen Cardiovascular status: blood pressure returned to baseline and stable Postop Assessment: no apparent nausea or vomiting Anesthetic complications: no   No notable events documented.  Last Vitals:  Vitals:   11/14/20 1245 11/14/20 1300  BP: 123/81 135/89  Pulse: 62 (!) 57  Resp: 17 11  Temp: 36.8 C   SpO2: 100% 100%    Last Pain:  Vitals:   11/14/20 1245  TempSrc:   PainSc: 2                  Trevor Iha

## 2020-11-14 NOTE — Transfer of Care (Signed)
Immediate Anesthesia Transfer of Care Note  Patient: Kristin Medina  Procedure(s) Performed: DILATATION AND EVACUATION CHROMOSOME STUDIES  Patient Location: PACU  Anesthesia Type:General  Level of Consciousness: sedated  Airway & Oxygen Therapy: Patient Spontanous Breathing and Patient connected to face mask oxygen  Post-op Assessment: Report given to RN and Post -op Vital signs reviewed and stable  Post vital signs: Reviewed and stable  Last Vitals:  Vitals Value Taken Time  BP 123/81 11/14/20 1245  Temp    Pulse 87 11/14/20 1251  Resp 24 11/14/20 1251  SpO2 100 % 11/14/20 1251  Vitals shown include unvalidated device data.  Last Pain:  Vitals:   11/14/20 0918  TempSrc: Oral  PainSc: 0-No pain      Patients Stated Pain Goal: 5 (11/14/20 2831)  Complications: No notable events documented.

## 2020-11-14 NOTE — Op Note (Signed)
Pre-Operative Diagnosis: Missed abortion at [redacted]w[redacted]d  Postoperative Diagnosis: Missed abortion at [redacted]w[redacted]d  Procedure: Dilation and curettage  Surgeon: Marlow Baars, MD  Operative Findings: anteverted uterus 8-10 week size  Specimen: products of conception, sent both for pathology and anora chromosome analysis  EBL: 5 cc    After adequate anesthesia was achieved, the patient placed in the dorsal lithotomy position in [place] stirrups.  She was prepped and draped in the usual sterile fashion.  The bivalve speculum was placed in the vagina and the anterior lip of the cervix grasped with a single-tooth tenaculum.   10 cc of 1% lidocaine was administered in a paracervical fashion. The cervix was serially dilated with Hank dilators. . A 9 mm suction curette was advanced to the fundus, the vacuum was engaged, and multiple suction passes were performed until the products of conception were evacuated. A Sharp curettage was performed and a gritty texture was noted. A final suction pass was performed with minimal results. This completed the procedure. A bimanual massage was performed and confirmed good uterine tone.  All instruments were removed from the vagina.  Pressure was used to achieve hemostasis at the tenaculum site. The patient tolerated the procedure well was brought to the recovery room in stable condition for the procedure. All sponge and needle counts correct x2.   [place], [facility]

## 2020-11-14 NOTE — H&P (Signed)
30 y.o. [id number] presents for [facility] for missed abortion.  She was seen for a pregnancy confirmation visit at [redacted]w[redacted]d, with ultrasound equal to LMP.  F/u ultrasound on 6/17 revealed a CRL measuring [redacted]w[redacted]d with absent fetal cardiac activity.  She had a workup for APLAS which was negative following her IUFD.     Past Medical History:  Diagnosis Date   History of perinatal fetal loss 05/17/2013   25 wks   Missed ab 11/11/2020    Past Surgical History:  Procedure Laterality Date   BREAST MASS EXCISION Left 08/30/2004   @ [facility]   CESAREAN SECTION N/A 09/09/2018   Procedure: CESAREAN SECTION;  Surgeon: Marlow Baars, MD;  Location: [facility];  Service: Obstetrics;  Laterality: N/A;   DILATION AND CURETTAGE OF UTERUS N/A 02/10/2016   Procedure: SUCTION DILATATION AND CURETTAGE;  Surgeon: Hermina Staggers, MD;  Location: [facility];  Service: Gynecology;  Laterality: N/A;    OB History  Gravida Para Term Preterm AB Living  6 2 1 1 4 1   SAB IAB Ectopic Multiple Live Births  2 2 0 0 1    # Outcome Date GA Lbr Len/2nd Weight Sex Delivery Anes PTL Lv  6 Term 09/09/18 [redacted]w[redacted]d 07:13 / 03:29 3580 g M CS-LTranv EPI  LIV  5 SAB 02/25/17 [redacted]w[redacted]d         4 SAB 03/2014 [redacted]w[redacted]d         3 Preterm 05/17/13 [redacted]w[redacted]d 21:25 / 00:16 921 g M Vag-Spont EPI  FD  2 IAB 2009          1 IAB 2008            Social History   Socioeconomic History   Marital status: Single    Spouse name: Not on file   Number of children: Not on file   Years of education: Not on file   Highest education level: Not on file  Occupational History   Not on file  Tobacco Use   Smoking status: Never   Smokeless tobacco: Never  Vaping Use   Vaping Use: Never used  Substance and Sexual Activity   Alcohol use: No   Drug use: Not Currently    Types: Marijuana   Sexual activity: Yes    Birth control/protection: None  Other Topics Concern   Not on file  Social History Narrative   Not on file   Social Determinants of Health   Financial Resource  Strain: Not on file  Food Insecurity: Not on file  Transportation Needs: Not on file  Physical Activity: Not on file  Stress: Not on file  Social Connections: Not on file  Intimate Partner Violence: Not on file   Patient has no known allergies.     Vitals:   11/14/20 0918  BP: 138/71  Pulse: 68  Resp: 20  Temp: 98.2 F (36.8 C)  SpO2: 100%     General:  NAD Abdomen:  soft  FHTs:  Absent    A/P   30 y.o.  32 for dilation and curettage for missed abortion   Elects for surgical management.  Discussed risks to include infection, bleeding, damage to surrounding structures (including but not limited to vagina, cervix, bladder, uterus), uterine perforation, need for additional procedures.  All questions answered and patient elects to proceed. Desires ANORA chromosome analysis [id number] rhogam in office on Friday  [facility] GEFFEL [place]

## 2020-11-14 NOTE — Anesthesia Procedure Notes (Signed)
Procedure Name: LMA Insertion Date/Time: 11/14/2020 12:12 PM Performed by: Burna Cash, CRNA Pre-anesthesia Checklist: Patient identified, Emergency Drugs available, Suction available and Patient being monitored Patient Re-evaluated:Patient Re-evaluated prior to induction Oxygen Delivery Method: Circle system utilized Preoxygenation: Pre-oxygenation with 100% oxygen Induction Type: IV induction Ventilation: Mask ventilation without difficulty LMA: LMA inserted LMA Size: 4.0 Number of attempts: 2 Airway Equipment and Method: Bite block Placement Confirmation: positive ETCO2 Tube secured with: Tape Dental Injury: Teeth and Oropharynx as per pre-operative assessment

## 2020-11-15 LAB — SURGICAL PATHOLOGY

## 2020-11-16 ENCOUNTER — Encounter (HOSPITAL_BASED_OUTPATIENT_CLINIC_OR_DEPARTMENT_OTHER): Payer: Self-pay | Admitting: Obstetrics

## 2021-02-23 ENCOUNTER — Encounter (HOSPITAL_COMMUNITY): Payer: Self-pay | Admitting: Emergency Medicine

## 2021-02-23 ENCOUNTER — Ambulatory Visit (HOSPITAL_COMMUNITY)
Admission: EM | Admit: 2021-02-23 | Discharge: 2021-02-23 | Disposition: A | Discharge status: Home / Self Care | Payer: 59 | Attending: Physician Assistant | Admitting: Physician Assistant

## 2021-02-23 ENCOUNTER — Other Ambulatory Visit: Payer: Self-pay

## 2021-02-23 DIAGNOSIS — Z20822 Contact with and (suspected) exposure to covid-19: Secondary | ICD-10-CM | POA: Diagnosis not present

## 2021-02-23 DIAGNOSIS — R062 Wheezing: Secondary | ICD-10-CM | POA: Insufficient documentation

## 2021-02-23 DIAGNOSIS — R0602 Shortness of breath: Secondary | ICD-10-CM

## 2021-02-23 DIAGNOSIS — J069 Acute upper respiratory infection, unspecified: Secondary | ICD-10-CM | POA: Insufficient documentation

## 2021-02-23 DIAGNOSIS — R030 Elevated blood-pressure reading, without diagnosis of hypertension: Secondary | ICD-10-CM | POA: Insufficient documentation

## 2021-02-23 DIAGNOSIS — R059 Cough, unspecified: Secondary | ICD-10-CM | POA: Diagnosis present

## 2021-02-23 LAB — POC INFLUENZA A AND B ANTIGEN (URGENT CARE ONLY)
INFLUENZA A ANTIGEN, POC: NEGATIVE
INFLUENZA B ANTIGEN, POC: NEGATIVE

## 2021-02-23 MED ORDER — ALBUTEROL SULFATE HFA 108 (90 BASE) MCG/ACT IN AERS
INHALATION_SPRAY | RESPIRATORY_TRACT | Status: AC
  Filled 2021-02-23: qty 6.7

## 2021-02-23 MED ORDER — PREDNISONE 20 MG PO TABS: 40.0000 mg | ORAL_TABLET | Freq: Every day | ORAL | 0 refills | Status: DC

## 2021-02-23 MED ORDER — ALBUTEROL SULFATE HFA 108 (90 BASE) MCG/ACT IN AERS
2.0000 | INHALATION_SPRAY | Freq: Once | RESPIRATORY_TRACT | Status: AC
  Administered 2021-02-23: 2 via RESPIRATORY_TRACT

## 2021-02-23 NOTE — ED Provider Notes (Signed)
[facility]    CSN: [id number] Arrival date & time: 02/23/21  1901      History   Chief Complaint Chief Complaint  Patient presents with   Cough   Shortness of Breath    HPI Kristin Medina is a 30 y.o. female.   Patient presents today with a 2 to 3-day history of cough and shortness of breath.  She denies any chest pain, palpitations, lightheadedness, nasal congestion, nausea, vomiting.  She has tried Mucinex at home without improvement of symptoms.  She does report that cough is productive with thick purulent sputum that had blood streaks earlier today.  She denies any recent antibiotic use.  She denies any history of VTE event.  She was recently pregnant but lost the pregnancy several weeks ago.  She denies history of asthma, COPD, smoking.  Patient was noted to have elevated blood pressure today.  She denies history of hypertension but this does run in her family.  She is unsure if there is any decongestant in the Mucinex she has been taking but does not believe so.  She denies any increase in caffeine or sodium consumption.  Denies any current chest pain but does report some shortness of breath but she attributes this to viral illness.  Denies any headache, dizziness, vision changes.   Past Medical History:  Diagnosis Date   History of perinatal fetal loss 05/17/2013   25 wks   Missed ab 11/11/2020    Patient Active Problem List   Diagnosis Date Noted   History of perinatal fetal loss 09/08/2018   Termination of pregnancy (fetus)    Pregnancy 05/17/2013    Past Surgical History:  Procedure Laterality Date   BREAST MASS EXCISION Left 08/30/2004   @ [facility]   CESAREAN SECTION N/A 09/09/2018   Procedure: CESAREAN SECTION;  Surgeon: Marlow Baars, MD;  Location: [facility];  Service: Obstetrics;  Laterality: N/A;   DILATION AND CURETTAGE OF UTERUS N/A 02/10/2016   Procedure: SUCTION DILATATION AND CURETTAGE;  Surgeon: Hermina Staggers, MD;  Location: [facility];   Service: Gynecology;  Laterality: N/A;   DILATION AND EVACUATION N/A 11/14/2020   Procedure: DILATATION AND EVACUATION;  Surgeon: Marlow Baars, MD;  Location: [facility];  Service: Gynecology;  Laterality: N/A;  Request chromosome studies    OB History     Gravida  6   Para  2   Term  1   Preterm  1   AB  4   Living  1      SAB  2   IAB  2   Ectopic  0   Multiple  0   Live Births  1            Home Medications    Prior to Admission medications   Medication Sig Start Date End Date Taking? Authorizing Provider  predniSONE (DELTASONE) 20 MG tablet Take 2 tablets (40 mg total) by mouth daily. 02/23/21  Yes Syleena Mchan K, PA-C  prenatal vitamin w/FE, FA (PRENATAL 1 + 1) 27-1 MG TABS tablet Take 1 tablet by mouth daily at 12 noon.    [provider]    Family History Family History  Problem Relation Age of Onset   Diabetes Father    Heart attack Paternal Uncle    Heart attack Paternal Grandfather     Social History Social History   Tobacco Use   Smoking status: Never   Smokeless tobacco: Never  Vaping Use  Vaping Use: Never used  Substance Use Topics   Alcohol use: No   Drug use: Not Currently    Types: Marijuana     Allergies   Patient has no known allergies.   Review of Systems Review of Systems  Constitutional:  Positive for activity change. Negative for fatigue and fever.  HENT:  Negative for congestion, sinus pressure, sneezing and sore throat.   Respiratory:  Positive for cough and shortness of breath.   Cardiovascular:  Negative for chest pain.  Gastrointestinal:  Negative for abdominal pain, diarrhea, nausea and vomiting.  Neurological:  Negative for dizziness, light-headedness and headaches.    Physical Exam Triage Vital Signs ED Triage Vitals  Enc Vitals Group     BP 02/23/21 1941 (!) 175/123     Pulse Rate 02/23/21 1941 85     Resp 02/23/21 1941 20     Temp 02/23/21 1941 (!) 97.4 F (36.3 C)      Temp Source 02/23/21 1941 Oral     SpO2 02/23/21 1941 100 %     Weight --      Height --      Head Circumference --      Peak Flow --      Pain Score 02/23/21 1939 0     Pain Loc --      Pain Edu? --      Excl. in GC? --    No data found.  Updated Vital Signs BP (!) 176/90 (BP Location: Right Arm)   Pulse 85   Temp (!) 97.4 F (36.3 C) (Oral)   Resp 20   LMP 02/19/2021   SpO2 100%   Visual Acuity Right Eye Distance:   Left Eye Distance:   Bilateral Distance:    Right Eye Near:   Left Eye Near:    Bilateral Near:     Physical Exam Vitals reviewed.  Constitutional:      General: She is awake. She is not in acute distress.    Appearance: Normal appearance. She is well-developed. She is not ill-appearing.     Comments: Very pleasant female appears stated age in no acute distress sitting comfortably in exam room  HENT:     Head: Normocephalic and atraumatic.     Right Ear: Tympanic membrane, ear canal and external ear normal. Tympanic membrane is not erythematous or bulging.     Left Ear: Tympanic membrane, ear canal and external ear normal. Tympanic membrane is not erythematous or bulging.     Nose:     Right Sinus: Maxillary sinus tenderness present. No frontal sinus tenderness.     Left Sinus: Maxillary sinus tenderness present. No frontal sinus tenderness.     Mouth/Throat:     Pharynx: Uvula midline. Posterior oropharyngeal erythema present. No oropharyngeal exudate.     Comments: Moderate erythema and drainage Cardiovascular:     Rate and Rhythm: Normal rate and regular rhythm.     Heart sounds: Normal heart sounds, S1 normal and S2 normal. No murmur heard. Pulmonary:     Effort: Pulmonary effort is normal.     Breath sounds: Examination of the right-lower field reveals wheezing. Examination of the left-lower field reveals wheezing. Wheezing present. No rhonchi or rales.     Comments: Wheezing noted bilateral bases improved with in office albuterol Psychiatric:         Behavior: Behavior is cooperative.     UC Treatments / Results  Labs (all labs ordered are listed, but only abnormal results are  displayed) Labs Reviewed  SARS CORONAVIRUS 2 (TAT 6-24 HRS)  POC INFLUENZA A AND B ANTIGEN (URGENT CARE ONLY)    EKG   Radiology No results found.  Procedures Procedures (including critical care time)  Medications Ordered in UC Medications  albuterol (VENTOLIN HFA) 108 (90 Base) MCG/ACT inhaler 2 puff (2 puffs Inhalation Given 02/23/21 2017)    Initial Impression / Assessment and Plan / UC Course  I have reviewed the triage vital signs and the nursing notes.  Pertinent labs & imaging results that were available during my care of the patient were reviewed by me and considered in my medical decision making (see chart for details).      Patient had significant improvement of symptoms with in office albuterol.  She was given albuterol inhaler to take, and instructed to use this every 4-6 hours as needed with shortness of breath or cough.  We will start prednisone taper and she was instructed not to take NSAIDs with this medication due to risk of GI bleeding.  Given improvement of symptoms with albuterol and medication for additional work-up at this time.  Flu test was negative.  COVID test is pending.  She was encouraged to use over-the-counter medications including Mucinex, Flonase, Tylenol for symptom relief.  Discussed alarm symptoms that warrant emergent evaluation.  Strict return precaution given to which she expressed understanding.  Blood pressure was elevated today.  Patient is unsure if she has been taking decongestants but was instructed to look at Mucinex to ensure there is no decongestant included in this.  Discussed that she should avoid decongestants, caffeine, salt.  She denies any signs/symptoms of endorgan damage she had resolution of shortness of breath with albuterol in clinic.  We discussed that if she has any recurrent symptoms  including chest pain, shortness of breath, headache, vision changes in the setting of high blood pressure she needs to go to the emergency room.  Recommended she monitor her blood pressure at home and if this persistently above 140/90 she should be reevaluated.  Final Clinical Impressions(s) / UC Diagnoses   Final diagnoses:  Viral URI with cough  Shortness of breath  Wheezing  Elevated blood pressure reading     Discharge Instructions      Take prednisone 40 mg for 4 days.  Do not take NSAIDs including aspirin, ibuprofen/Advil, naproxen/Aleve with this medication due to risk of GI bleeding.  You can use Tylenol and plain Mucinex.  We will contact you for COVID test is positive.  If you have any worsening symptoms please return for reevaluation.  Your blood pressure was elevated.  Please monitor this at home if it is persistently above 140/90 you need to be reevaluated.  Avoid any decongestants, caffeine, salt.  If you have chest pain, shortness of breath, headache, dizziness in the setting of high blood pressure you need to go to the emergency room.     ED Prescriptions     Medication Sig Dispense Auth. Provider   predniSONE (DELTASONE) 20 MG tablet Take 2 tablets (40 mg total) by mouth daily. 8 tablet Mcclain Shall, Noberto Retort, PA-C      PDMP not reviewed this encounter.   Jeani Hawking, PA-C 02/23/21 2105

## 2021-02-23 NOTE — ED Triage Notes (Signed)
Pt c/o SOB, cough and congestion for 2-3 days. Reports noticed some blood when coughed up today. Taking Mucinex at home for congestion.

## 2021-02-23 NOTE — Discharge Instructions (Addendum)
Take prednisone 40 mg for 4 days.  Do not take NSAIDs including aspirin, ibuprofen/Advil, naproxen/Aleve with this medication due to risk of GI bleeding.  You can use Tylenol and plain Mucinex.  We will contact you for COVID test is positive.  If you have any worsening symptoms please return for reevaluation.  Your blood pressure was elevated.  Please monitor this at home if it is persistently above 140/90 you need to be reevaluated.  Avoid any decongestants, caffeine, salt.  If you have chest pain, shortness of breath, headache, dizziness in the setting of high blood pressure you need to go to the emergency room.

## 2021-02-24 ENCOUNTER — Other Ambulatory Visit: Payer: Self-pay

## 2021-02-24 ENCOUNTER — Emergency Department (HOSPITAL_COMMUNITY)
Admission: EM | Admit: 2021-02-24 | Discharge: 2021-02-24 | Disposition: A | Discharge status: Home / Self Care | Payer: 59 | Attending: Physician Assistant | Admitting: Physician Assistant

## 2021-02-24 ENCOUNTER — Encounter (HOSPITAL_COMMUNITY): Payer: Self-pay

## 2021-02-24 ENCOUNTER — Emergency Department (HOSPITAL_COMMUNITY): Payer: 59

## 2021-02-24 DIAGNOSIS — R0602 Shortness of breath: Secondary | ICD-10-CM | POA: Diagnosis not present

## 2021-02-24 DIAGNOSIS — R059 Cough, unspecified: Secondary | ICD-10-CM | POA: Diagnosis not present

## 2021-02-24 LAB — I-STAT BETA HCG BLOOD, ED (MC, WL, AP ONLY): I-stat hCG, quantitative: <5 m[IU]/mL (ref <5)

## 2021-02-24 LAB — SARS CORONAVIRUS 2 (TAT 6-24 HRS): SARS Coronavirus 2: NEGATIVE

## 2021-02-24 NOTE — ED Triage Notes (Signed)
Pt reports SOB and productive cough, was seen at urgent care earlier today but when she got home she felt rattling when breathing.

## 2021-02-24 NOTE — ED Provider Notes (Signed)
[facility] EMERGENCY DEPARTMENT Provider Note   CSN: [id number] Arrival date & time: 02/24/21  9937     History Chief Complaint  Patient presents with   Shortness of Breath    Kristin Medina is a 30 y.o. female.  HPI Pt complains of shortness of breath.  Patient she tearful in triage.  She endorses shortness of breath and productive cough over the last several days, with was seen at urgent care and was given albuterol and a course of prednisone.  She denies a history of asthma.  Patient states that she went home and continued to hear "rattling in her chest", which caused her to get anxious and concerned in which this prompted her to come to the ER.  She denies any chest pain.  Denies any pleuritic symptoms.  Not on OCPs, no leg swelling.  She was tested for COVID at the urgent care.    Past Medical History:  Diagnosis Date   History of perinatal fetal loss 05/17/2013   25 wks   Missed ab 11/11/2020    Patient Active Problem List   Diagnosis Date Noted   History of perinatal fetal loss 09/08/2018   Termination of pregnancy (fetus)    Pregnancy 05/17/2013    Past Surgical History:  Procedure Laterality Date   BREAST MASS EXCISION Left 08/30/2004   @ [facility]   CESAREAN SECTION N/A 09/09/2018   Procedure: CESAREAN SECTION;  Surgeon: Marlow Baars, MD;  Location: [facility];  Service: Obstetrics;  Laterality: N/A;   DILATION AND CURETTAGE OF UTERUS N/A 02/10/2016   Procedure: SUCTION DILATATION AND CURETTAGE;  Surgeon: Hermina Staggers, MD;  Location: [facility];  Service: Gynecology;  Laterality: N/A;   DILATION AND EVACUATION N/A 11/14/2020   Procedure: DILATATION AND EVACUATION;  Surgeon: Marlow Baars, MD;  Location: [facility] ;  Service: Gynecology;  Laterality: N/A;  Request chromosome studies     OB History     Gravida  6   Para  2   Term  1   Preterm  1   AB  4   Living  1      SAB  2   IAB  2   Ectopic  0   Multiple   0   Live Births  1           Family History  Problem Relation Age of Onset   Diabetes Father    Heart attack Paternal Uncle    Heart attack Paternal Grandfather     Social History   Tobacco Use   Smoking status: Never   Smokeless tobacco: Never  Vaping Use   Vaping Use: Never used  Substance Use Topics   Alcohol use: No   Drug use: Not Currently    Types: Marijuana    Home Medications Prior to Admission medications   Medication Sig Start Date End Date Taking? Authorizing Provider  predniSONE (DELTASONE) 20 MG tablet Take 2 tablets (40 mg total) by mouth daily. 02/23/21   Raspet, Noberto Retort, PA-C  prenatal vitamin w/FE, FA (PRENATAL 1 + 1) 27-1 MG TABS tablet Take 1 tablet by mouth daily at 12 noon.    [provider]    Allergies    Patient has no known allergies.  Review of Systems   Review of Systems Ten systems reviewed and are negative for acute change, except as noted in the HPI.   Physical Exam Updated Vital Signs BP (!) 145/97 (BP Location: Right  Arm)   Pulse 84   Temp 99.5 F (37.5 C) (Oral)   Resp 16   Ht 5\' 3"  (1.6 m)   Wt 77.1 kg   LMP 02/19/2021   SpO2 100%   BMI 30.11 kg/m   Physical Exam Vitals and nursing note reviewed.  Constitutional:      General: She is not in acute distress.    Appearance: She is well-developed.     Comments: Anxious, tearful, speaking in full sentences  HENT:     Head: Normocephalic and atraumatic.  Eyes:     Conjunctiva/sclera: Conjunctivae normal.  Cardiovascular:     Rate and Rhythm: Normal rate and regular rhythm.     Heart sounds: No murmur heard. Pulmonary:     Effort: Pulmonary effort is normal. No respiratory distress.     Breath sounds: Normal breath sounds. No decreased breath sounds, wheezing, rhonchi or rales.  Chest:     Chest wall: No tenderness.  Abdominal:     Palpations: Abdomen is soft.     Tenderness: There is no abdominal tenderness.  Musculoskeletal:     Cervical back: Neck  supple.     Right lower leg: No tenderness. No edema.     Left lower leg: No tenderness. No edema.  Skin:    General: Skin is warm and dry.  Neurological:     Mental Status: She is alert.  Psychiatric:        Mood and Affect: Mood is anxious.    ED Results / Procedures / Treatments   Labs (all labs ordered are listed, but only abnormal results are displayed) Labs Reviewed  I-STAT BETA HCG BLOOD, ED ([facility], [facility], [facility] ONLY)    EKG None  Radiology DG Chest 2 View  Result Date: 02/24/2021 CLINICAL DATA:  Cough EXAM: CHEST - 2 VIEW COMPARISON:  None. FINDINGS: The heart size and mediastinal contours are within normal limits. Both lungs are clear. The visualized skeletal structures are unremarkable. IMPRESSION: No active cardiopulmonary disease. Electronically Signed   By: 02/26/2021 M.D.   On: 02/24/2021 03:54    Procedures Procedures   Medications Ordered in ED Medications - No data to display  ED Course  I have reviewed the triage vital signs and the nursing notes.  Pertinent labs & imaging results that were available during my care of the patient were reviewed by me and considered in my medical decision making (see chart for details).    MDM Rules/Calculators/A&P                           30 year old female with complaints of shortness of breath.  On arrival, she is anxious, however speaking full sentences without increased work of breathing.  Lung sounds are clear.  Slightly hypertensive but afebrile, not tachycardic, tachypneic or hypoxic.  No lower extremity swelling noted.  Not on OCPs.  No recent travel.  Low suspicion for PE.  She was tested for COVID and the flu at urgent care and both test were negative.  She was given albuterol and a course of steroids.  Chest x-ray without any evidence of pneumonia.  Low suspicion for PE.  Encouraged the patient to continue with current regimen and follow-up with PCP.  No evidence of respiratory distress, do not think she needs any  additional work-up.  We discussed return precautions.  She voiced understanding and is agreeable.  Stable for discharge. Final Clinical Impression(s) / ED Diagnoses Final diagnoses:  Shortness of breath    Rx / DC Orders ED Discharge Orders     None        Leone Brand 02/24/21 0617    Nira Conn, MD 02/24/21 [phone]

## 2021-02-24 NOTE — Discharge Instructions (Addendum)
You were evaluated in the Emergency Department and after careful evaluation, we did not find any emergent condition requiring admission or further testing in the hospital.  Please continue with your prescribed treatment regimen from urgent care.  Your chest x-ray did not show any evidence of pneumonia.  Please make sure to follow-up with your primary care doctor, if you do not have 1, please call the phone number in your discharge paperwork in order to establish with 1.  Please return to the Emergency Department if you experience any worsening of your condition.  Thank you for allowing [place] to be a part of your care.

## 2021-02-24 NOTE — ED Provider Notes (Addendum)
Emergency Medicine Provider Triage Evaluation Note  Kristin Medina , a 30 y.o. female  was evaluated in triage.  Pt complains of shortness of breath.  Patient she tearful in triage.  She endorses shortness of breath and productive cough over the last several days, with was seen at urgent care and was given albuterol and a course of prednisone.  Patient states that she went home and continued to hear "rattling in her chest", which caused her to get anxious and concerned in which this prompted her to come to the ER.  She denies any chest pain.  Denies any pleuritic symptoms.  Not on OCPs, no leg swelling.  She was tested for COVID at the urgent care.  Review of Systems  Positive: As above Negative: As above  Physical Exam  BP (!) 154/100 (BP Location: Right Arm)   Pulse 81   Temp 98.8 F (37.1 C) (Oral)   Resp 18   Ht 5\' 3"  (1.6 m)   Wt 77.1 kg   LMP 02/19/2021   SpO2 100%   BMI 30.11 kg/m  Gen:   Awake, no distress   Resp:  Normal effort, speaking in full sentences, lung sounds clear MSK:   Moves extremities without difficulty  Other:    Medical Decision Making  Medically screening exam initiated at 3:16 AM.  Appropriate orders placed.  Emera C Reierson was informed that the remainder of the evaluation will be completed by another provider, this initial triage assessment does not replace that evaluation, and the importance of remaining in the ED until their evaluation is complete.     Pollie Meyer 02/24/21 02/26/21    2202, MD 02/24/21 0349    02/26/21, PA-C 02/24/21 02/26/21    [id number], MD 02/24/21 [phone]

## 2022-02-21 ENCOUNTER — Telehealth: Payer: Self-pay | Admitting: Physician Assistant

## 2022-02-21 NOTE — Telephone Encounter (Signed)
Scheduled appt per 9/26 referral. Pt is aware of appt date and time. Pt is aware to arrive 15 mins prior to appt time and to bring and updated insurance card. Pt is aware of appt location.   

## 2022-03-16 ENCOUNTER — Encounter: Payer: Self-pay | Admitting: Physician Assistant

## 2022-03-16 ENCOUNTER — Telehealth: Payer: Self-pay | Admitting: Physician Assistant

## 2022-03-16 ENCOUNTER — Other Ambulatory Visit: Payer: Self-pay

## 2022-03-16 ENCOUNTER — Inpatient Hospital Stay: Payer: 59

## 2022-03-16 ENCOUNTER — Inpatient Hospital Stay: Payer: 59 | Attending: Physician Assistant | Admitting: Physician Assistant

## 2022-03-16 VITALS — BP 150/100 | HR 84 | Temp 98.1°F | Resp 15 | Wt 157.1 lb

## 2022-03-16 DIAGNOSIS — D509 Iron deficiency anemia, unspecified: Secondary | ICD-10-CM

## 2022-03-16 DIAGNOSIS — D500 Iron deficiency anemia secondary to blood loss (chronic): Secondary | ICD-10-CM | POA: Diagnosis present

## 2022-03-16 DIAGNOSIS — N920 Excessive and frequent menstruation with regular cycle: Secondary | ICD-10-CM

## 2022-03-16 DIAGNOSIS — Z789 Other specified health status: Secondary | ICD-10-CM | POA: Diagnosis not present

## 2022-03-16 LAB — CMP (CANCER CENTER ONLY)
ALT: 6 U/L (ref 0–44)
AST: 10 U/L — ABNORMAL LOW (ref 15–41)
Albumin: 4.4 g/dL (ref 3.5–5.0)
Alkaline Phosphatase: 59 U/L (ref 38–126)
Anion gap: 6 (ref 5–15)
BUN: 13 mg/dL (ref 6–20)
CO2: 27 mmol/L (ref 22–32)
Calcium: 9.1 mg/dL (ref 8.9–10.3)
Chloride: 107 mmol/L (ref 98–111)
Creatinine: 0.59 mg/dL (ref 0.44–1.00)
GFR, Estimated: >60 mL/min (ref >60)
Glucose, Bld: 85 mg/dL (ref 70–99)
Potassium: 3.2 mmol/L — ABNORMAL LOW (ref 3.5–5.1)
Sodium: 140 mmol/L (ref 135–145)
Total Bilirubin: 0.5 mg/dL (ref 0.3–1.2)
Total Protein: 8 g/dL (ref 6.5–8.1)

## 2022-03-16 LAB — CBC WITH DIFFERENTIAL (CANCER CENTER ONLY)
Abs Immature Granulocytes: 0.01 10*3/uL (ref 0.00–0.07)
Basophils Absolute: 0 10*3/uL (ref 0.0–0.1)
Basophils Relative: 0 %
Eosinophils Absolute: 0.1 10*3/uL (ref 0.0–0.5)
Eosinophils Relative: 1 %
HCT: 28 % — ABNORMAL LOW (ref 36.0–46.0)
Hemoglobin: 8.8 g/dL — ABNORMAL LOW (ref 12.0–15.0)
Immature Granulocytes: 0 %
Lymphocytes Relative: 25 %
Lymphs Abs: 1.7 10*3/uL (ref 0.7–4.0)
MCH: 22.2 pg — ABNORMAL LOW (ref 26.0–34.0)
MCHC: 31.4 g/dL (ref 30.0–36.0)
MCV: 70.5 fL — ABNORMAL LOW (ref 80.0–100.0)
Monocytes Absolute: 0.3 10*3/uL (ref 0.1–1.0)
Monocytes Relative: 4 %
Neutro Abs: 4.8 10*3/uL (ref 1.7–7.7)
Neutrophils Relative %: 70 %
Platelet Count: 419 10*3/uL — ABNORMAL HIGH (ref 150–400)
RBC: 3.97 MIL/uL (ref 3.87–5.11)
RDW: 18.5 % — ABNORMAL HIGH (ref 11.5–15.5)
WBC Count: 6.9 10*3/uL (ref 4.0–10.5)
nRBC: 0 % (ref 0.0–0.2)

## 2022-03-16 LAB — RETIC PANEL
Immature Retic Fract: 26.2 % — ABNORMAL HIGH (ref 2.3–15.9)
RBC.: 3.93 MIL/uL (ref 3.87–5.11)
Retic Count, Absolute: 37.3 10*3/uL (ref 19.0–186.0)
Retic Ct Pct: 1 % (ref 0.4–3.1)
Reticulocyte Hemoglobin: 23.4 pg — ABNORMAL LOW (ref >27.9)

## 2022-03-16 LAB — IRON AND IRON BINDING CAPACITY (CC-WL,HP ONLY)
Iron: 84 ug/dL (ref 28–170)
Saturation Ratios: 18 % (ref 10.4–31.8)
TIBC: 469 ug/dL — ABNORMAL HIGH (ref 250–450)
UIBC: 385 ug/dL (ref 148–442)

## 2022-03-16 LAB — VITAMIN B12: Vitamin B-12: 400 pg/mL (ref 180–914)

## 2022-03-16 LAB — FERRITIN: Ferritin: 4 ng/mL — ABNORMAL LOW (ref 11–307)

## 2022-03-16 NOTE — Progress Notes (Signed)
[facility] [phone]   [phone]  INITIAL CONSULT NOTE  Patient Care Team: Patient, No Pcp Per as PCP - General (General Practice)  Hematological/Oncological History -02/09/2022: Labs from PCP, Carilyn Goodpasture NP:WBC 4.7, Hgb 8.8 (L), MCV 71 (L), Plt 476 (H), TIBC 461 (H), Iron 23 (L), Saturation 5% (L), Ferritin 2.8(L)  -03/16/2022: Establish care with [facility] hematology with Dr. Jeanie Sewer and Georga Kaufmann, PA-C  CHIEF COMPLAINTS/PURPOSE OF CONSULTATION:  Iron deficiency anemia  HISTORY OF PRESENTING ILLNESS:  Senta C Popson 31 y.o. female with medical history significant for hypertension presents to the hematology clinic for iron deficiency anemia.  She is unaccompanied for this visit.  On exam today, Ms. Stillson reports feeling fatigue but is able to complete her daily activities on her own.  She reports being a vegetarian in the past but is trying to incorporate meat.  She denies nausea, vomiting or abdominal pain.  Her bowel habits are unchanged without any recurrent episodes of diarrhea or constipation.  She denies easy bruising or signs of active bleeding.  She does have monthly menstrual cycles that have been heavy since her pregnancy approximately 3 years ago.  She reports each cycle last 4 days with heavy bleeding.  She is not taking any medications to help with her menstrual bleeding.  Patient has some shortness of breath mainly with exertion but none at rest.  She denies fevers, chills, night sweats, chest pain or cough.  She has no other complaints.  Rest of the 10 point ROS is below.  MEDICAL HISTORY:  Past Medical History:  Diagnosis Date   History of perinatal fetal loss 05/17/2013   25 wks   Hypertension    Missed ab 11/11/2020    SURGICAL HISTORY: Past Surgical History:  Procedure Laterality Date   BREAST MASS EXCISION Left 08/30/2004   @ [facility]   CESAREAN SECTION N/A 09/09/2018   Procedure: CESAREAN SECTION;  Surgeon:  Marlow Baars, MD;  Location: [facility] LD [facility];  Service: Obstetrics;  Laterality: N/A;   DILATION AND CURETTAGE OF UTERUS N/A 02/10/2016   Procedure: SUCTION DILATATION AND CURETTAGE;  Surgeon: Hermina Staggers, MD;  Location: [facility];  Service: Gynecology;  Laterality: N/A;   DILATION AND EVACUATION N/A 11/14/2020   Procedure: DILATATION AND EVACUATION;  Surgeon: Marlow Baars, MD;  Location: [facility] [place];  Service: Gynecology;  Laterality: N/A;  Request chromosome studies    SOCIAL HISTORY: Social History   Socioeconomic History   Marital status: Single    Spouse name: Not on file   Number of children: Not on file   Years of education: Not on file   Highest education level: Not on file  Occupational History   Not on file  Tobacco Use   Smoking status: Every Day    Types: Cigars   Smokeless tobacco: Never  Vaping Use   Vaping Use: Never used  Substance and Sexual Activity   Alcohol use: No   Drug use: Not Currently    Types: Marijuana   Sexual activity: Yes    Birth control/protection: None  Other Topics Concern   Not on file  Social History Narrative   Not on file   Social Determinants of Health   Financial Resource Strain: Not on file  Food Insecurity: Not on file  Transportation Needs: Not on file  Physical Activity: Not on file  Stress: Not on file  Social Connections: Not on file  Intimate Partner Violence: Not on file  FAMILY HISTORY: Family History  Problem Relation Age of Onset   Diabetes Father    Heart attack Paternal Uncle    Heart attack Paternal Grandfather     ALLERGIES:  has No Known Allergies.  MEDICATIONS:  Current Outpatient Medications  Medication Sig Dispense Refill   albuterol (VENTOLIN HFA) 108 (90 Base) MCG/ACT inhaler SMARTSIG:1-2 Puff(s) Via Inhaler Every 4-6 Hours PRN     amLODipine (NORVASC) 10 MG tablet Take 10 mg by mouth daily.     ibuprofen (ADVIL) 200 MG tablet Take 200 mg by mouth every 6 (six) hours as needed for  mild pain.     predniSONE (DELTASONE) 20 MG tablet Take 2 tablets (40 mg total) by mouth daily. (Patient not taking: Reported on 03/16/2022) 8 tablet 0   No current facility-administered medications for this visit.    REVIEW OF SYSTEMS:   Constitutional: ( - ) fevers, ( - )  chills , ( - ) night sweats Eyes: ( - ) blurriness of vision, ( - ) double vision, ( - ) watery eyes Ears, nose, mouth, throat, and face: ( - ) mucositis, ( - ) sore throat Respiratory: ( - ) cough, ( - ) dyspnea, ( - ) wheezes Cardiovascular: ( - ) palpitation, ( - ) chest discomfort, ( - ) lower extremity swelling Gastrointestinal:  ( - ) nausea, ( - ) heartburn, ( - ) change in bowel habits Skin: ( - ) abnormal skin rashes Lymphatics: ( - ) new lymphadenopathy, ( - ) easy bruising Neurological: ( - ) numbness, ( - ) tingling, ( - ) new weaknesses Behavioral/Psych: ( - ) mood change, ( - ) new changes  All other systems were reviewed with the patient and are negative.  PHYSICAL EXAMINATION: ECOG PERFORMANCE STATUS: 1 - Symptomatic but completely ambulatory  Vitals:   03/16/22 1106  BP: (!) 150/100  Pulse: 84  Resp: 15  Temp: 98.1 F (36.7 C)  SpO2: 100%   Filed Weights   03/16/22 1106  Weight: 157 lb 1.6 oz (71.3 kg)    GENERAL: well appearing female in NAD  SKIN: skin color, texture, turgor are normal, no rashes or significant lesions EYES: conjunctiva are pink and non-injected, sclera clear OROPHARYNX: no exudate, no erythema; lips, buccal mucosa, and tongue normal  NECK: supple, non-tender LYMPH:  no palpable lymphadenopathy in the cervical or supraclavicular lymph nodes.  LUNGS: clear to auscultation and percussion with normal breathing effort HEART: regular rate & rhythm and no murmurs and no lower extremity edema Musculoskeletal: no cyanosis of digits and no clubbing  PSYCH: alert & oriented x 3, fluent speech NEURO: no focal motor/sensory deficits  LABORATORY DATA:  I have reviewed the  data as listed    Latest Ref Rng & Units 03/16/2022   12:21 PM 09/09/2018    6:13 AM 09/08/2018   12:15 AM  CBC  WBC 4.0 - 10.5 K/uL 6.9  14.7  7.6   Hemoglobin 12.0 - 15.0 g/dL 8.8  9.6  24.2   Hematocrit 36.0 - 46.0 % 28.0  29.4  34.3   Platelets 150 - 400 K/uL 419  223  275        Latest Ref Rng & Units 03/16/2022   12:21 PM 01/02/2016    1:24 PM 08/21/2012    7:40 PM  CMP  Glucose 70 - 99 mg/dL 85  85  99   BUN 6 - 20 mg/dL 13  7  15    Creatinine 0.44 - 1.00  mg/dL 0.59  0.62  0.63   Sodium 135 - 145 mmol/L 140  134  141   Potassium 3.5 - 5.1 mmol/L 3.2  3.1  3.4   Chloride 98 - 111 mmol/L 107  101  102   CO2 22 - 32 mmol/L 27  24  25    Calcium 8.9 - 10.3 mg/dL 9.1  9.1  9.6   Total Protein 6.5 - 8.1 g/dL 8.0  8.4  9.1   Total Bilirubin 0.3 - 1.2 mg/dL 0.5  0.6  0.4   Alkaline Phos 38 - 126 U/L 59  50  58   AST 15 - 41 U/L 10  11  19    ALT 0 - 44 U/L 6  11  12     ASSESSMENT & PLAN Sugey C Rybolt is a 31 y.o. female who presents to the hematology clinic for iron deficiency anemia.  He is noncompliant with taking iron pills as she usually forgets and did experience some constipation.  She is under the care of of a gynecologist and plans to follow-up discuss interventions to help with menstrual bleeding.  We will proceed with labs today to check iron panel.  If there is evidence of persistent iron deficiency anemia, we will proceed with IV iron to help bolster her counts.  She expressed understanding of the plan provided and would like to move forward with work-up.  # Iron Deficiency Anemia 2/2 to GYN Bleeding -- Findings are consistent with iron deficiency anemia secondary to patient's menorrhagia --Encouraged her to follow-up with OB/GYN for better control of her menstrual cycles --We will confirm iron deficiency anemia by ordering iron panel and ferritin as well as reticulocytes, CBC, and CMP --Poor compliance with PO iron and reports constipation.  --We will plan to proceed  with IV iron therapy in order to help bolster the patient's blood counts --Plan for return to clinic in 8 weeks with labs.   #H/O vegetarian diet and numbness in legs: --Will check vitamin B12 levels today. If normal, recommend f/u with PCP.    Orders Placed This Encounter  Procedures   CBC with Differential ([facility] Only)    Standing Status:   Future    Number of Occurrences:   1    Standing Expiration Date:   03/16/2023   Ferritin    Standing Status:   Future    Number of Occurrences:   1    Standing Expiration Date:   03/16/2023   CMP ([facility] only)    Standing Status:   Future    Number of Occurrences:   1    Standing Expiration Date:   03/16/2023   Iron and Iron Binding Capacity ([facility] only)    Standing Status:   Future    Number of Occurrences:   1    Standing Expiration Date:   03/16/2023   Retic Panel    Standing Status:   Future    Number of Occurrences:   1    Standing Expiration Date:   03/16/2023   Vitamin B12    Standing Status:   Future    Number of Occurrences:   1    Standing Expiration Date:   03/16/2023    All questions were answered. The patient knows to call the clinic with any problems, questions or concerns.  I have spent a total of 60 minutes minutes of face-to-face and non-face-to-face time, preparing to see the patient, obtaining and/or reviewing separately obtained history, performing a medically appropriate  examination, counseling and educating the patient, ordering medications/tests/procedures,documenting clinical information in the electronic health record, and care coordination.   Georga Kaufmann, PA-C Department of Hematology/Oncology [facility] at [facility] Phone: [phone]  Patient was seen with Dr. Leonides Schanz  I have read the above note and personally examined the patient. I agree with the assessment and plan as noted above.  Briefly Mrs. Highley is a 31 year old female with medical history  significant for iron deficiency anemia who presents for evaluation.  The patient is a vegan and does not eat meat.  Additionally she is having some numbness of her lower extremities which is concerning for possible vitamin B12 deficiency.  Our work-up we will include iron panel as well as vitamin B12 in order to determine her baseline nutritional levels.  If she is found to be iron deficient would recommend pursuing IV iron therapy in order to help rapidly bolster her counts.  Patient voiced understanding of the plan moving forward and was in agreement to receive IV iron therapy if necessary.   Ulysees Barns, MD Department of Hematology/Oncology [facility] at [facility] Phone: [phone] Pager: [phone] Email: [email or web address]

## 2022-03-16 NOTE — Telephone Encounter (Signed)
Per 10/20 los called and spoke to pt about appointment  

## 2022-03-19 ENCOUNTER — Telehealth: Payer: Self-pay | Admitting: Pharmacy Technician

## 2022-03-19 ENCOUNTER — Other Ambulatory Visit (HOSPITAL_COMMUNITY): Payer: Self-pay | Admitting: Physician Assistant

## 2022-03-19 ENCOUNTER — Telehealth: Payer: Self-pay | Admitting: *Deleted

## 2022-03-19 NOTE — Telephone Encounter (Addendum)
Dr. Charlies Silvers,  Monoferric is non preferred medication for [facility] and will likely be denied if patient has not failed and or tried step therapy. Preferred medication is venofer.  Would you like to try venofer?  Ref# [id number] (active)

## 2022-03-19 NOTE — Telephone Encounter (Signed)
Attempted to reach patient to give results of lab values and need for future IV [place].  Left message pending call back.

## 2022-03-19 NOTE — Telephone Encounter (Signed)
Thanks, we will schedule the patient as soon as possible. Kristin Medina

## 2022-03-19 NOTE — Telephone Encounter (Signed)
-----   Message from Lincoln Brigham, PA-C sent at 03/19/2022 11:41 AM EDT ----- Please notify patient that labs show iron deficiency anemia and she requires IV iron. We will see her back 8 weeks after she receives her infusion.

## 2022-03-20 ENCOUNTER — Other Ambulatory Visit: Payer: Self-pay | Admitting: Pharmacy Technician

## 2022-03-27 ENCOUNTER — Encounter: Payer: Self-pay | Admitting: Physician Assistant

## 2022-03-30 ENCOUNTER — Ambulatory Visit (INDEPENDENT_AMBULATORY_CARE_PROVIDER_SITE_OTHER): Payer: 59

## 2022-03-30 VITALS — BP 122/78 | HR 70 | Temp 98.0°F | Resp 18 | Ht 63.0 in | Wt 156.0 lb

## 2022-03-30 DIAGNOSIS — D509 Iron deficiency anemia, unspecified: Secondary | ICD-10-CM

## 2022-03-30 MED ORDER — SODIUM CHLORIDE 0.9 % IV SOLN
200.0000 mg | Freq: Once | INTRAVENOUS | Status: AC
  Administered 2022-03-30: 200 mg via INTRAVENOUS
  Filled 2022-03-30: qty 10

## 2022-03-30 NOTE — Progress Notes (Signed)
Diagnosis: Iron Deficiency Anemia  Provider:  Marshell Garfinkel MD  Procedure: Infusion  IV Type: Peripheral, IV Location: L Antecubital  Venofer (Iron Sucrose), Dose: 200 mg  Infusion Start Time: 0917  Infusion Stop Time: 0347  Post Infusion IV Care: Observation period completed and Peripheral IV Discontinued  Discharge: Condition: Good, Destination: Home . AVS provided to patient.   Performed by:  Cleophus Molt, RN

## 2022-03-30 NOTE — Patient Instructions (Signed)

## 2022-04-06 ENCOUNTER — Ambulatory Visit (INDEPENDENT_AMBULATORY_CARE_PROVIDER_SITE_OTHER): Payer: 59 | Admitting: *Deleted

## 2022-04-06 VITALS — BP 119/87 | HR 73 | Temp 98.5°F | Resp 20 | Ht 63.0 in | Wt 156.0 lb

## 2022-04-06 DIAGNOSIS — D509 Iron deficiency anemia, unspecified: Secondary | ICD-10-CM

## 2022-04-06 MED ORDER — SODIUM CHLORIDE 0.9 % IV SOLN
200.0000 mg | Freq: Once | INTRAVENOUS | Status: AC
  Administered 2022-04-06: 200 mg via INTRAVENOUS
  Filled 2022-04-06: qty 10

## 2022-04-06 NOTE — Progress Notes (Signed)
Diagnosis: Iron Deficiency Anemia  Provider:  Chilton Greathouse MD  Procedure: Infusion  IV Type: Peripheral, IV Location: L Antecubital  Venofer (Iron Sucrose), Dose: 200 mg  Infusion Start Time: 0912 am  Infusion Stop Time: 0928 am  Post Infusion IV Care: Observation period completed and Peripheral IV Discontinued  Discharge: Condition: Good, Destination: Home . AVS provided to patient.   Performed by:  Forrest Moron, RN

## 2022-04-13 ENCOUNTER — Ambulatory Visit (INDEPENDENT_AMBULATORY_CARE_PROVIDER_SITE_OTHER): Payer: 59

## 2022-04-13 VITALS — BP 108/71 | HR 57 | Temp 98.4°F | Resp 16 | Ht 63.0 in | Wt 158.4 lb

## 2022-04-13 DIAGNOSIS — D509 Iron deficiency anemia, unspecified: Secondary | ICD-10-CM | POA: Diagnosis not present

## 2022-04-13 MED ORDER — SODIUM CHLORIDE 0.9 % IV SOLN
200.0000 mg | Freq: Once | INTRAVENOUS | Status: AC
  Administered 2022-04-13: 200 mg via INTRAVENOUS
  Filled 2022-04-13: qty 10

## 2022-04-13 NOTE — Progress Notes (Signed)
Diagnosis: Iron Deficiency Anemia  Provider:  Chilton Greathouse MD  Procedure: Infusion  IV Type: Peripheral, IV Location: L Antecubital  Venofer (Iron Sucrose), Dose: 200 mg  Infusion Start Time: 0916  Infusion Stop Time: 0940  Post Infusion IV Care: Peripheral IV Discontinued  Discharge: Condition: Good, Destination: Home . AVS provided to patient.   Performed by:  Nat Math, RN

## 2022-04-27 ENCOUNTER — Ambulatory Visit (INDEPENDENT_AMBULATORY_CARE_PROVIDER_SITE_OTHER): Payer: 59

## 2022-04-27 VITALS — BP 118/74 | HR 72 | Temp 98.4°F | Resp 16 | Ht 63.0 in | Wt 154.0 lb

## 2022-04-27 DIAGNOSIS — D509 Iron deficiency anemia, unspecified: Secondary | ICD-10-CM

## 2022-04-27 MED ORDER — SODIUM CHLORIDE 0.9 % IV SOLN
200.0000 mg | Freq: Once | INTRAVENOUS | Status: AC
  Administered 2022-04-27: 200 mg via INTRAVENOUS
  Filled 2022-04-27: qty 10

## 2022-04-27 NOTE — Progress Notes (Signed)
Diagnosis: Iron Deficiency Anemia  Provider:  Chilton Greathouse MD  Procedure: Infusion  IV Type: Peripheral, IV Location: L Antecubital  Venofer (Iron Sucrose), Dose: 200 mg  Infusion Start Time: 0910  Infusion Stop Time: 0927  Post Infusion IV Care: Peripheral IV Discontinued  Discharge: Condition: Good, Destination: Home . AVS provided to patient.   Performed by:  Nat Math, RN

## 2022-05-04 ENCOUNTER — Ambulatory Visit (INDEPENDENT_AMBULATORY_CARE_PROVIDER_SITE_OTHER): Payer: 59

## 2022-05-04 VITALS — BP 119/75 | HR 66 | Temp 98.0°F | Resp 18 | Ht 63.0 in | Wt 156.6 lb

## 2022-05-04 DIAGNOSIS — D509 Iron deficiency anemia, unspecified: Secondary | ICD-10-CM | POA: Diagnosis not present

## 2022-05-04 MED ORDER — SODIUM CHLORIDE 0.9 % IV SOLN
200.0000 mg | Freq: Once | INTRAVENOUS | Status: AC
  Administered 2022-05-04: 200 mg via INTRAVENOUS
  Filled 2022-05-04: qty 10

## 2022-05-04 NOTE — Progress Notes (Signed)
Diagnosis: Iron Deficiency Anemia  Provider:  Chilton Greathouse MD  Procedure: Infusion  IV Type: Peripheral, IV Location: L Antecubital  Venofer (Iron Sucrose), Dose: 200 mg  Infusion Start Time: 0959  Infusion Stop Time: 1018  Post Infusion IV Care: Peripheral IV Discontinued  Discharge: Condition: Good, Destination: Home . AVS provided to patient.   Performed by:  Adriana Mccallum, RN

## 2022-05-10 ENCOUNTER — Other Ambulatory Visit: Payer: Self-pay | Admitting: Physician Assistant

## 2022-05-10 DIAGNOSIS — D509 Iron deficiency anemia, unspecified: Secondary | ICD-10-CM

## 2022-05-11 ENCOUNTER — Other Ambulatory Visit: Payer: 59

## 2022-05-11 ENCOUNTER — Ambulatory Visit: Payer: 59 | Admitting: Physician Assistant

## 2022-07-06 ENCOUNTER — Encounter (HOSPITAL_COMMUNITY): Payer: Self-pay

## 2022-07-06 ENCOUNTER — Ambulatory Visit (HOSPITAL_COMMUNITY)
Admission: EM | Admit: 2022-07-06 | Discharge: 2022-07-06 | Disposition: A | Discharge status: Home / Self Care | Payer: 59 | Attending: Family Medicine | Admitting: Family Medicine

## 2022-07-06 DIAGNOSIS — L500 Allergic urticaria: Secondary | ICD-10-CM

## 2022-07-06 MED ORDER — PREDNISONE 20 MG PO TABS: 40.0000 mg | ORAL_TABLET | Freq: Every day | ORAL | 0 refills | Status: AC

## 2022-07-06 NOTE — Discharge Instructions (Addendum)
Take prednisone 20 mg--2 daily for 5 days   You can take Zyrtec or Claritin or Allegra for the itching; these are less sedating.  Or you could just take Benadryl or Chlor-Trimeton at night to help the itching not keep you up at night.

## 2022-07-06 NOTE — ED Provider Notes (Addendum)
[place]    CSN: [id number] Arrival date & time: 07/06/22  0849      History   Chief Complaint Chief Complaint  Patient presents with   Rash       HPI Kristin Medina is a 32 y.o. female.    Rash  Here for red raised itchy spots. First noted to spots on her right forearm when she awoke 3 days ago.  Then 2 days ago she developed a similar larger spot on her left upper arm some on her right elbow and a couple on her right upper back.  No dyspnea and no cough or fever.  Past Medical History:  Diagnosis Date   History of perinatal fetal loss 05/17/2013   25 wks   Hypertension    Missed ab 11/11/2020    Patient Active Problem List   Diagnosis Date Noted   Iron deficiency anemia 03/16/2022   History of perinatal fetal loss 09/08/2018   Termination of pregnancy (fetus)    Pregnancy 05/17/2013    Past Surgical History:  Procedure Laterality Date   BREAST MASS EXCISION Left 08/30/2004   @ [place] N/A 09/09/2018   Procedure: CESAREAN SECTION;  Surgeon: Jerelyn Charles, MD;  Location: [facility];  Service: Obstetrics;  Laterality: N/A;   DILATION AND CURETTAGE OF UTERUS N/A 02/10/2016   Procedure: SUCTION DILATATION AND CURETTAGE;  Surgeon: Chancy Milroy, MD;  Location: [facility];  Service: Gynecology;  Laterality: N/A;   DILATION AND EVACUATION N/A 11/14/2020   Procedure: DILATATION AND EVACUATION;  Surgeon: Jerelyn Charles, MD;  Location: [facility];  Service: Gynecology;  Laterality: N/A;  Request chromosome studies    OB History     Gravida  6   Para  2   Term  1   Preterm  1   AB  4   Living  1      SAB  2   IAB  2   Ectopic  0   Multiple  0   Live Births  1            Home Medications    Prior to Admission medications   Medication Sig Start Date End Date Taking? Authorizing Provider  predniSONE (DELTASONE) 20 MG tablet Take 2 tablets (40 mg total) by mouth daily with breakfast for 5 days.  07/06/22 07/11/22 Yes Barrett Henle, MD  albuterol (VENTOLIN HFA) 108 (90 Base) MCG/ACT inhaler SMARTSIG:1-2 Puff(s) Via Inhaler Every 4-6 Hours PRN 11/06/21   [provider]  amLODipine (NORVASC) 10 MG tablet Take 10 mg by mouth daily. 01/30/22   [provider]  ibuprofen (ADVIL) 200 MG tablet Take 200 mg by mouth every 6 (six) hours as needed for mild pain.    [provider]    Family History Family History  Problem Relation Age of Onset   Diabetes Father    Heart attack Paternal Uncle    Heart attack Paternal Grandfather     Social History Social History   Tobacco Use   Smoking status: Every Day    Types: Cigars   Smokeless tobacco: Never  Vaping Use   Vaping Use: Never used  Substance Use Topics   Alcohol use: No   Drug use: Not Currently    Types: Marijuana     Allergies   Patient has no known allergies.   Review of Systems Review of Systems  Skin:  Positive for rash.  Physical Exam Triage Vital Signs ED Triage Vitals  Enc Vitals Group     BP 07/06/22 0857 130/84     Pulse Rate 07/06/22 0857 91     Resp 07/06/22 0857 16     Temp 07/06/22 0857 98.2 F (36.8 C)     Temp Source 07/06/22 0857 Oral     SpO2 07/06/22 0857 100 %     Weight --      Height --      Head Circumference --      Peak Flow --      Pain Score 07/06/22 0858 0     Pain Loc --      Pain Edu? --      Excl. in [place]? --    No data found.  Updated Vital Signs BP 130/84 (BP Location: Left Arm)   Pulse 91   Temp 98.2 F (36.8 C) (Oral)   Resp 16   LMP 06/07/2022 (Approximate)   SpO2 100%   Visual Acuity Right Eye Distance:   Left Eye Distance:   Bilateral Distance:    Right Eye Near:   Left Eye Near:    Bilateral Near:     Physical Exam Vitals reviewed.  Constitutional:      General: She is not in acute distress.    Appearance: She is not ill-appearing, toxic-appearing or diaphoretic.  HENT:     Mouth/Throat:     Mouth: Mucous membranes  are moist.     Comments: No swelling of the tongue or lips Eyes:     Extraocular Movements: Extraocular movements intact.     Conjunctiva/sclera: Conjunctivae normal.     Pupils: Pupils are equal, round, and reactive to light.  Cardiovascular:     Rate and Rhythm: Normal rate and regular rhythm.     Heart sounds: No murmur heard. Pulmonary:     Effort: Pulmonary effort is normal. No respiratory distress.     Breath sounds: Normal breath sounds. No stridor. No wheezing, rhonchi or rales.  Skin:    Coloration: Skin is not jaundiced or pale.     Comments: There are raised erythematous bumps.  On her right forearm and elbow they range in size from 1 cm to 3 cm.  There is a larger 1 on her left upper arm that is about 5 cm in diameter.  On her right upper lumbar region there are 2 raised erythematous bumps that are about a centimeter in diameter.  There is no fluctuance or drainage from any of these lesions.  Neurological:     General: No focal deficit present.     Mental Status: She is alert and oriented to person, place, and time.  Psychiatric:        Behavior: Behavior normal.      UC Treatments / Results  Labs (all labs ordered are listed, but only abnormal results are displayed) Labs Reviewed - No data to display  EKG   Radiology No results found.  Procedures Procedures (including critical care time)  Medications Ordered in UC Medications - No data to display  Initial Impression / Assessment and Plan / UC Course  I have reviewed the triage vital signs and the nursing notes.  Pertinent labs & imaging results that were available during my care of the patient were reviewed by me and considered in my medical decision making (see chart for details).        I do think this is most likely urticaria, but could be some  insect bite.  Since there are a couple of lesions that it is going to be hard for her to get to to apply topical steroids, she is treated instead with 5 days  of prednisone. Final Clinical Impressions(s) / UC Diagnoses   Final diagnoses:  Allergic urticaria     Discharge Instructions      Take prednisone 20 mg--2 daily for 5 days   You can take Zyrtec or Claritin or Allegra for the itching; these are less sedating.  Or you could just take Benadryl or Chlor-Trimeton at night to help the itching not keep you up at night.     ED Prescriptions     Medication Sig Dispense Auth. Provider   predniSONE (DELTASONE) 20 MG tablet Take 2 tablets (40 mg total) by mouth daily with breakfast for 5 days. 10 tablet Windy Carina Gwenlyn Perking, MD      PDMP not reviewed this encounter.   Barrett Henle, MD 07/06/22 [id number]    Barrett Henle, MD 07/06/22 [phone]

## 2022-07-06 NOTE — ED Triage Notes (Signed)
Pt states itchy rash/bug bites to her arms and back for the past 2 days.

## 2022-09-18 ENCOUNTER — Encounter (HOSPITAL_COMMUNITY): Payer: Self-pay

## 2022-09-18 ENCOUNTER — Ambulatory Visit (HOSPITAL_COMMUNITY)
Admission: EM | Admit: 2022-09-18 | Discharge: 2022-09-18 | Disposition: A | Discharge status: Home / Self Care | Payer: 59 | Attending: Internal Medicine | Admitting: Internal Medicine

## 2022-09-18 DIAGNOSIS — H10022 Other mucopurulent conjunctivitis, left eye: Secondary | ICD-10-CM

## 2022-09-18 MED ORDER — ERYTHROMYCIN 5 MG/GM OP OINT: TOPICAL_OINTMENT | OPHTHALMIC | 0 refills | Status: DC

## 2022-09-18 NOTE — ED Provider Notes (Signed)
[facility]    CSN: [id number] Arrival date & time: 09/18/22  1123      History   Chief Complaint Chief Complaint  Patient presents with   Eye Pain    HPI Kristin Medina is a 32 y.o. female.   Patient presents to urgent care for evaluation of left eye pain, swelling, redness, and clear drainage that started last night.  This morning upon waking, she noticed that her left eye sensitive to light.  Denies symptoms to the right eye.  No blurry vision, decreased visual acuity, spots in vision, headache, sore throat, ear pain, neck pain, fever/chills, or other viral URI symptoms.  No recent known sick contacts with similar symptoms.  No trauma or injury to the left eye.  She does not wear contacts or glasses for vision correction.  Denies eye itching, rhinorrhea, and sneezing.  Right eye is unaffected.  She does have a young toddler that attends daycare but the toddler is not experiencing any symptoms.  Has not attempted use of any over-the-counter medications to help with symptoms but has been using warm compresses and states this has provided some relief.   Eye Pain    Past Medical History:  Diagnosis Date   History of perinatal fetal loss 05/17/2013   25 wks   Hypertension    Missed ab 11/11/2020    Patient Active Problem List   Diagnosis Date Noted   Iron deficiency anemia 03/16/2022   History of perinatal fetal loss 09/08/2018   Termination of pregnancy (fetus)    Pregnancy 05/17/2013    Past Surgical History:  Procedure Laterality Date   BREAST MASS EXCISION Left 08/30/2004   @ [facility]   CESAREAN SECTION N/A 09/09/2018   Procedure: CESAREAN SECTION;  Surgeon: Marlow Baars, MD;  Location: [facility];  Service: Obstetrics;  Laterality: N/A;   DILATION AND CURETTAGE OF UTERUS N/A 02/10/2016   Procedure: SUCTION DILATATION AND CURETTAGE;  Surgeon: Hermina Staggers, MD;  Location: [facility];  Service: Gynecology;  Laterality: N/A;   DILATION AND EVACUATION N/A  11/14/2020   Procedure: DILATATION AND EVACUATION;  Surgeon: Marlow Baars, MD;  Location: [facility];  Service: Gynecology;  Laterality: N/A;  Request chromosome studies    OB History     Gravida  6   Para  2   Term  1   Preterm  1   AB  4   Living  1      SAB  2   IAB  2   Ectopic  0   Multiple  0   Live Births  1            Home Medications    Prior to Admission medications   Medication Sig Start Date End Date Taking? Authorizing Provider  erythromycin ophthalmic ointment Place a 1/2 inch ribbon of ointment into the lower eyelid. 09/18/22  Yes Carlisle Beers, FNP  albuterol (VENTOLIN HFA) 108 (90 Base) MCG/ACT inhaler SMARTSIG:1-2 Puff(s) Via Inhaler Every 4-6 Hours PRN 11/06/21   [provider]  amLODipine (NORVASC) 10 MG tablet Take 10 mg by mouth daily. 01/30/22   [provider]  ibuprofen (ADVIL) 200 MG tablet Take 200 mg by mouth every 6 (six) hours as needed for mild pain.    [provider]    Family History Family History  Problem Relation Age of Onset   Diabetes Father    Heart attack Paternal Uncle    Heart attack Paternal  Grandfather     Social History Social History   Tobacco Use   Smoking status: Every Day    Types: Cigars   Smokeless tobacco: Never  Vaping Use   Vaping Use: Never used  Substance Use Topics   Alcohol use: No   Drug use: Not Currently    Types: Marijuana     Allergies   Patient has no known allergies.   Review of Systems Review of Systems  Eyes:  Positive for pain.  Per HPI   Physical Exam Triage Vital Signs ED Triage Vitals [09/18/22 1142]  Enc Vitals Group     BP (!) 136/94     Pulse Rate 73     Resp 16     Temp 98.5 F (36.9 C)     Temp Source Oral     SpO2 99 %     Weight      Height      Head Circumference      Peak Flow      Pain Score 0     Pain Loc      Pain Edu?      Excl. in [place]?    No data found.  Updated Vital Signs BP (!)  136/94 (BP Location: Right Arm)   Pulse 73   Temp 98.5 F (36.9 C) (Oral)   Resp 16   LMP 08/23/2022 (Approximate)   SpO2 99%   Visual Acuity Right Eye Distance:   Left Eye Distance:   Bilateral Distance:    Right Eye Near:   Left Eye Near:    Bilateral Near:     Physical Exam Vitals and nursing note reviewed.  Constitutional:      Appearance: She is not ill-appearing or toxic-appearing.  HENT:     Head: Normocephalic and atraumatic.     Right Ear: Hearing, tympanic membrane, ear canal and external ear normal.     Left Ear: Hearing, tympanic membrane, ear canal and external ear normal.     Nose: Nose normal.     Mouth/Throat:     Lips: Pink.  Eyes:     General: Lids are normal. Vision grossly intact. Gaze aligned appropriately.        Left eye: Discharge (Clear currently, however crusty) present.    Extraocular Movements: Extraocular movements intact.     Conjunctiva/sclera:     Left eye: Left conjunctiva is injected. No chemosis, exudate or hemorrhage.    Pupils: Pupils are equal, round, and reactive to light.     Comments: EOMs intact without pain or dizziness elicited.  No signs of preseptal erythema or swelling/warmth.  Cardiovascular:     Rate and Rhythm: Normal rate and regular rhythm.     Heart sounds: Normal heart sounds, S1 normal and S2 normal.  Pulmonary:     Effort: Pulmonary effort is normal. No respiratory distress.     Breath sounds: Normal breath sounds and air entry.  Musculoskeletal:     Cervical back: Neck supple.  Skin:    General: Skin is warm and dry.     Capillary Refill: Capillary refill takes less than 2 seconds.     Findings: No rash.  Neurological:     General: No focal deficit present.     Mental Status: She is alert and oriented to person, place, and time. Mental status is at baseline.     Cranial Nerves: No dysarthria or facial asymmetry.  Psychiatric:        Mood and Affect: Mood  normal.        Speech: Speech normal.         Behavior: Behavior normal.        Thought Content: Thought content normal.        Judgment: Judgment normal.      UC Treatments / Results  Labs (all labs ordered are listed, but only abnormal results are displayed) Labs Reviewed - No data to display  EKG   Radiology No results found.  Procedures Procedures (including critical care time)  Medications Ordered in UC Medications - No data to display  Initial Impression / Assessment and Plan / UC Course  I have reviewed the triage vital signs and the nursing notes.  Pertinent labs & imaging results that were available during my care of the patient were reviewed by me and considered in my medical decision making (see chart for details).   1.  Mucopurulent conjunctivitis of left eye Antibiotic medication to eyes as prescribed. Advised patient to change pillowcase after 2 to 3 days of antibiotics to avoid reinfection.  Tylenol may be used every 6 hours as needed for pain and discomfort.  Advised patient to avoid scratching the eye and wash hands frequently.  Warm compresses to be used prior to administration of eyedrops to reduce inflammation and encourage drainage of infected material from the eye.  Follow-up with urgent care as needed if symptoms do not improve in the next 24 to 48 hours with antibiotic eyedrops or if patient develops blurry vision/worsening visual acuity.     Discussed physical exam and available lab work findings in clinic with patient.  Counseled patient regarding appropriate use of medications and potential side effects for all medications recommended or prescribed today. Discussed red flag signs and symptoms of worsening condition,when to call the PCP office, return to urgent care, and when to seek higher level of care in the emergency department. Patient verbalizes understanding and agreement with plan. All questions answered. Patient discharged in stable condition.    Final Clinical Impressions(s) / UC Diagnoses    Final diagnoses:  Mucopurulent conjunctivitis of left eye     Discharge Instructions      You have bacterial conjunctivitis (pink eye) which is an eye infection.    - Use antibiotic eye medication sent to pharmacy as directed.  - Change your pillowcase after 2 to 3 days to avoid reinfection.  - You may take Tylenol every 6 hours as needed for any pain you may have.  - Avoid scratching your eye.  Wash your hands frequently to avoid spread of infection to others.  Perform warm compresses to your eye before applying the eye medication.  If you develop any new or worsening symptoms or do not improve in the next 2 to 3 days, please return.  If your symptoms are severe, please go to the emergency room.  Follow-up with your primary care provider for further evaluation and management of your symptoms as well as ongoing wellness visits.  I hope you feel better!   ED Prescriptions     Medication Sig Dispense Auth. Provider   erythromycin ophthalmic ointment Place a 1/2 inch ribbon of ointment into the lower eyelid. 3.5 g Carlisle Beers, FNP      PDMP not reviewed this encounter.   Carlisle Beers, [place] 09/18/22 1208

## 2022-09-18 NOTE — ED Triage Notes (Signed)
Pt states left eye redness and swelling since last night. States it is sensitive to the light.

## 2022-09-18 NOTE — Discharge Instructions (Signed)
You have bacterial conjunctivitis (pink eye) which is an eye infection.    - Use antibiotic eye medication sent to pharmacy as directed.  - Change your pillowcase after 2 to 3 days to avoid reinfection.  - You may take Tylenol every 6 hours as needed for any pain you may have.  - Avoid scratching your eye.  Wash your hands frequently to avoid spread of infection to others.  Perform warm compresses to your eye before applying the eye medication.  If you develop any new or worsening symptoms or do not improve in the next 2 to 3 days, please return.  If your symptoms are severe, please go to the emergency room.  Follow-up with your primary care provider for further evaluation and management of your symptoms as well as ongoing wellness visits.  I hope you feel better! 

## 2023-02-09 ENCOUNTER — Other Ambulatory Visit: Payer: Self-pay

## 2023-02-09 ENCOUNTER — Encounter (HOSPITAL_COMMUNITY): Payer: Self-pay | Admitting: Obstetrics

## 2023-02-09 ENCOUNTER — Inpatient Hospital Stay (HOSPITAL_COMMUNITY): Payer: 59

## 2023-02-09 ENCOUNTER — Inpatient Hospital Stay (HOSPITAL_COMMUNITY)
Admission: AD | Admit: 2023-02-09 | Discharge: 2023-02-09 | Disposition: A | Discharge status: Home / Self Care | Payer: 59 | Attending: Obstetrics and Gynecology | Admitting: Obstetrics and Gynecology

## 2023-02-09 DIAGNOSIS — O23591 Infection of other part of genital tract in pregnancy, first trimester: Secondary | ICD-10-CM | POA: Insufficient documentation

## 2023-02-09 DIAGNOSIS — O209 Hemorrhage in early pregnancy, unspecified: Secondary | ICD-10-CM | POA: Diagnosis not present

## 2023-02-09 DIAGNOSIS — Z6791 Unspecified blood type, Rh negative: Secondary | ICD-10-CM | POA: Insufficient documentation

## 2023-02-09 DIAGNOSIS — Z23 Encounter for immunization: Secondary | ICD-10-CM | POA: Diagnosis not present

## 2023-02-09 DIAGNOSIS — O09291 Supervision of pregnancy with other poor reproductive or obstetric history, first trimester: Secondary | ICD-10-CM | POA: Insufficient documentation

## 2023-02-09 DIAGNOSIS — Z3A08 8 weeks gestation of pregnancy: Secondary | ICD-10-CM | POA: Insufficient documentation

## 2023-02-09 DIAGNOSIS — B9689 Other specified bacterial agents as the cause of diseases classified elsewhere: Secondary | ICD-10-CM | POA: Insufficient documentation

## 2023-02-09 DIAGNOSIS — O208 Other hemorrhage in early pregnancy: Secondary | ICD-10-CM | POA: Insufficient documentation

## 2023-02-09 LAB — COMPREHENSIVE METABOLIC PANEL
ALT: 9 U/L (ref 0–44)
AST: 13 U/L — ABNORMAL LOW (ref 15–41)
Albumin: 3.4 g/dL — ABNORMAL LOW (ref 3.5–5.0)
Alkaline Phosphatase: 51 U/L (ref 38–126)
Anion gap: 14 (ref 5–15)
BUN: 6 mg/dL (ref 6–20)
CO2: 25 mmol/L (ref 22–32)
Calcium: 9.2 mg/dL (ref 8.9–10.3)
Chloride: 100 mmol/L (ref 98–111)
Creatinine, Ser: 0.66 mg/dL (ref 0.44–1.00)
GFR, Estimated: >60 mL/min (ref >60)
Glucose, Bld: 97 mg/dL (ref 70–99)
Potassium: 3.4 mmol/L — ABNORMAL LOW (ref 3.5–5.1)
Sodium: 139 mmol/L (ref 135–145)
Total Bilirubin: 0.4 mg/dL (ref 0.3–1.2)
Total Protein: 7.4 g/dL (ref 6.5–8.1)

## 2023-02-09 LAB — POCT PREGNANCY, URINE: Preg Test, Ur: POSITIVE — AB

## 2023-02-09 LAB — WET PREP, GENITAL
Sperm: NONE SEEN
Trich, Wet Prep: NONE SEEN
WBC, Wet Prep HPF POC: <10 (ref <10)
Yeast Wet Prep HPF POC: NONE SEEN

## 2023-02-09 LAB — CBC
HCT: 34.5 % — ABNORMAL LOW (ref 36.0–46.0)
Hemoglobin: 11.2 g/dL — ABNORMAL LOW (ref 12.0–15.0)
MCH: 25.9 pg — ABNORMAL LOW (ref 26.0–34.0)
MCHC: 32.5 g/dL (ref 30.0–36.0)
MCV: 79.9 fL — ABNORMAL LOW (ref 80.0–100.0)
Platelets: 376 10*3/uL (ref 150–400)
RBC: 4.32 MIL/uL (ref 3.87–5.11)
RDW: 17.7 % — ABNORMAL HIGH (ref 11.5–15.5)
WBC: 8.1 10*3/uL (ref 4.0–10.5)
nRBC: 0 % (ref 0.0–0.2)

## 2023-02-09 LAB — ABO/RH
ABO/RH(D): A NEG
Antibody Screen: NEGATIVE

## 2023-02-09 LAB — HCG, QUANTITATIVE, PREGNANCY: hCG, Beta Chain, Quant, S: 109118 m[IU]/mL — ABNORMAL HIGH (ref <5)

## 2023-02-09 MED ORDER — RHO D IMMUNE GLOBULIN 1500 UNIT/2ML IJ SOSY
300.0000 ug | PREFILLED_SYRINGE | Freq: Once | INTRAMUSCULAR | Status: AC
  Administered 2023-02-09: 300 ug via INTRAMUSCULAR
  Filled 2023-02-09: qty 2

## 2023-02-09 MED ORDER — METRONIDAZOLE 500 MG PO TABS: 500.0000 mg | ORAL_TABLET | Freq: Two times a day (BID) | ORAL | 0 refills | Status: DC

## 2023-02-09 NOTE — MAU Note (Signed)
Kristin Medina is a 32 y.o. at Unknown here in MAU reporting: vaginal bleeding that started 1250 today while working at home. Patient wearing a pad currently. Reports bright red bleeding that was heavy at home. Denies abdominal pain. Denies any vaginal odor, itching or pain. Last sexual intercourse 1 week ago   LMP: 12/18/22 patient stated she is unsure of the exact date. Onset of complaint: 1250 Pain score: 0 Vitals:   02/09/23 1323  BP: 137/75  Pulse: 65  Resp: 17  Temp: 97.9 F (36.6 C)     FHT:n/a  Lab orders placed from triage:

## 2023-02-09 NOTE — MAU Provider Note (Signed)
History     CSN: [id number]  Arrival date and time: 02/09/23 1308   None     Chief Complaint  Patient presents with   Vaginal Bleeding   Kristin Medina is a 32 y.o. [id number] at [redacted]w[redacted]d who presents for vaginal bleeding in early pregnancy. Reports having heavy bleeding around 12:50 this afternoon and immediately came to MAU for evaluation. She denies any large passage of clots/tissue and describes it all as fresh blood. Last had intercourse about a week ago. No recent fevers, chills, nausea, vomiting, abdominal pain, c/d, urinary symptoms.   Past Medical History:  Diagnosis Date   History of perinatal fetal loss 05/17/2013   25 wks   Hypertension    Missed ab 11/11/2020    Past Surgical History:  Procedure Laterality Date   BREAST MASS EXCISION Left 08/30/2004   @ [facility]   CESAREAN SECTION N/A 09/09/2018   Procedure: CESAREAN SECTION;  Surgeon: Marlow Baars, MD;  Location: [facility];  Service: Obstetrics;  Laterality: N/A;   DILATION AND CURETTAGE OF UTERUS N/A 02/10/2016   Procedure: SUCTION DILATATION AND CURETTAGE;  Surgeon: Hermina Staggers, MD;  Location: [facility];  Service: Gynecology;  Laterality: N/A;   DILATION AND EVACUATION N/A 11/14/2020   Procedure: DILATATION AND EVACUATION;  Surgeon: Marlow Baars, MD;  Location: [facility];  Service: Gynecology;  Laterality: N/A;  Request chromosome studies    Family History  Problem Relation Age of Onset   Diabetes Father    Heart attack Paternal Uncle    Heart attack Paternal Grandfather     Social History   Tobacco Use   Smoking status: Former    Types: Cigars   Smokeless tobacco: Never  Vaping Use   Vaping status: Never Used  Substance Use Topics   Alcohol use: No   Drug use: Not Currently    Types: Marijuana    Allergies: No Known Allergies  Medications Prior to Admission  Medication Sig Dispense Refill Last Dose   albuterol (VENTOLIN HFA) 108 (90 Base) MCG/ACT inhaler SMARTSIG:1-2 Puff(s) Via  Inhaler Every 4-6 Hours PRN      amLODipine (NORVASC) 10 MG tablet Take 10 mg by mouth daily.      erythromycin ophthalmic ointment Place a 1/2 inch ribbon of ointment into the lower eyelid. 3.5 g 0    ibuprofen (ADVIL) 200 MG tablet Take 200 mg by mouth every 6 (six) hours as needed for mild pain.       ROS performed and pertinent positives and negatives are as documented in HPI. Physical Exam   Blood pressure 137/75, pulse 65, temperature 97.9 F (36.6 C), temperature source Oral, resp. rate 17, height 5\' 3"  (1.6 m), weight 85.2 kg, last menstrual period 08/23/2022.  Physical Exam Constitutional:      General: She is not in acute distress.    Appearance: Normal appearance.  HENT:     Head: Normocephalic and atraumatic.  Cardiovascular:     Rate and Rhythm: Normal rate and regular rhythm.     Heart sounds: Normal heart sounds.  Pulmonary:     Effort: Pulmonary effort is normal. No respiratory distress.     Breath sounds: Normal breath sounds.  Abdominal:     General: There is no distension.     Palpations: Abdomen is soft.     Tenderness: There is no abdominal tenderness. There is no right CVA tenderness or left CVA tenderness.  Musculoskeletal:        General: Normal  range of motion.  Skin:    General: Skin is warm and dry.     Findings: No rash.  Neurological:     General: No focal deficit present.     Mental Status: She is alert and oriented to person, place, and time.     MAU Course  Procedures  MDM 32 y.o. [id number] at [redacted]w[redacted]d presents to MAU for vaginal bleeding in early pregnancy. Pt with known h/o recurrent early pregnancy loss and a second trimester fetal demise. Vital signs are stable and exam is benign. Labs are notable for Rh neg status, HgB ok, wet prep w +clue cells, U/S with SIUP, no visualized subchorionic hemorrhage. Discussed results in detail with patient and her increased risk of SAB -- return precautions discussed. Will give RhoGAM today. Discussed option  to treat BV and pt desires abx treatment -- will send in to pharmacy. Stable for d/c.   Assessment and Plan  Vaginal bleeding in pregnancy, first trimester Unknown etiology  discussed threatened Ab dx w pt and signs to look for  RhoGAM today  stable for d/c   Bacterial vaginosis Rx sent for Flagyl  Patient discussed with Dr. Raina Mina 02/09/2023, 1:53 PM

## 2023-02-10 LAB — RH IG WORKUP (INCLUDES ABO/RH)
Gestational Age(Wks): 8
Unit division: 0

## 2023-02-11 LAB — GC/CHLAMYDIA PROBE AMP (~~LOC~~) NOT AT ARMC
Chlamydia: NEGATIVE
Comment: NEGATIVE
Comment: NORMAL
Neisseria Gonorrhea: NEGATIVE

## 2023-03-04 LAB — OB RESULTS CONSOLE HEPATITIS B SURFACE ANTIGEN: Hepatitis B Surface Ag: NEGATIVE

## 2023-03-04 LAB — OB RESULTS CONSOLE GC/CHLAMYDIA
Chlamydia: NEGATIVE
Neisseria Gonorrhea: NEGATIVE

## 2023-03-04 LAB — OB RESULTS CONSOLE RPR: RPR: NONREACTIVE

## 2023-03-04 LAB — OB RESULTS CONSOLE RUBELLA ANTIBODY, IGM: Rubella: IMMUNE

## 2023-03-04 LAB — HEPATITIS C ANTIBODY: HCV Ab: NEGATIVE

## 2023-03-04 LAB — OB RESULTS CONSOLE HIV ANTIBODY (ROUTINE TESTING): HIV: NONREACTIVE

## 2023-03-04 LAB — OB RESULTS CONSOLE ANTIBODY SCREEN: Antibody Screen: NEGATIVE

## 2023-08-20 ENCOUNTER — Telehealth (HOSPITAL_COMMUNITY): Payer: Self-pay | Admitting: *Deleted

## 2023-08-20 NOTE — Patient Instructions (Signed)
 Kristin Medina  08/20/2023   Your procedure is scheduled on:  09/03/2023  Arrive at 0530 at Entrance C on [address] at [facility]  and [employer]. You are invited to use the FREE valet parking or use the Visitor's parking deck.  Pick up the phone at the desk and dial [phone].  Call this number if you have problems the morning of surgery: [phone]  Remember:   Do not eat food:(After Midnight) Desps de medianoche.  You may drink clear liquids until arrival at __0530___.  Clear liquids means a liquid you can see thru.  It can have color such as Cola or Kool aid.  Tea is OK and coffee as long as no milk or creamer of any kind.  Take these medicines the morning of surgery with A SIP OF WATER:  Bring your inhaler   Do not wear jewelry, make-up or nail polish.  Do not wear lotions, powders, or perfumes. Do not wear deodorant.  Do not shave 48 hours prior to surgery.  Do not bring valuables to the hospital.  [facility] is not   responsible for any belongings or valuables brought to the hospital.  Contacts, dentures or bridgework may not be worn into surgery.  Leave suitcase in the car. After surgery it may be brought to your room.  For patients admitted to the hospital, checkout time is 11:00 AM the day of              discharge.      Please read over the following fact sheets that you were given:     Preparing for Surgery

## 2023-08-20 NOTE — Telephone Encounter (Signed)
 Preadmission screen

## 2023-08-21 ENCOUNTER — Encounter (HOSPITAL_COMMUNITY): Payer: Self-pay

## 2023-09-02 ENCOUNTER — Encounter (HOSPITAL_COMMUNITY)
Admission: RE | Admit: 2023-09-02 | Discharge: 2023-09-02 | Disposition: A | Discharge status: Home / Self Care | Source: Ambulatory Visit | Attending: Obstetrics | Admitting: Obstetrics

## 2023-09-02 ENCOUNTER — Encounter (HOSPITAL_COMMUNITY): Payer: Self-pay | Admitting: Obstetrics

## 2023-09-02 DIAGNOSIS — Z01812 Encounter for preprocedural laboratory examination: Secondary | ICD-10-CM | POA: Insufficient documentation

## 2023-09-02 DIAGNOSIS — Z3A38 38 weeks gestation of pregnancy: Secondary | ICD-10-CM | POA: Insufficient documentation

## 2023-09-02 LAB — CBC
HCT: 31.4 % — ABNORMAL LOW (ref 36.0–46.0)
Hemoglobin: 10 g/dL — ABNORMAL LOW (ref 12.0–15.0)
MCH: 26.2 pg (ref 26.0–34.0)
MCHC: 31.8 g/dL (ref 30.0–36.0)
MCV: 82.2 fL (ref 80.0–100.0)
Platelets: 288 10*3/uL (ref 150–400)
RBC: 3.82 MIL/uL — ABNORMAL LOW (ref 3.87–5.11)
RDW: 15.9 % — ABNORMAL HIGH (ref 11.5–15.5)
WBC: 6.3 10*3/uL (ref 4.0–10.5)
nRBC: 0 % (ref 0.0–0.2)

## 2023-09-02 LAB — TYPE AND SCREEN
ABO/RH(D): A NEG
Antibody Screen: NEGATIVE

## 2023-09-02 LAB — RPR: RPR Ser Ql: NONREACTIVE

## 2023-09-02 NOTE — Anesthesia Preprocedure Evaluation (Signed)
 Anesthesia Evaluation  Patient identified by MRN, date of birth, ID band Patient awake    Reviewed: Allergy & Precautions, NPO status , Patient's Chart, lab work & pertinent test results  Airway Mallampati: II  TM Distance: >3 FB Neck ROM: Full    Dental no notable dental hx.    Pulmonary former smoker   Pulmonary exam normal        Cardiovascular hypertension, Pt. on medications Normal cardiovascular exam     Neuro/Psych negative neurological ROS  negative psych ROS   GI/Hepatic negative GI ROS, Neg liver ROS,,,  Endo/Other  negative endocrine ROS    Renal/GU negative Renal ROS     Musculoskeletal negative musculoskeletal ROS (+)    Abdominal  (+) + obese  Peds  Hematology  (+) Blood dyscrasia, anemia   Anesthesia Other Findings Repeat Cesarean  Hypertension    Reproductive/Obstetrics (+) Pregnancy                             Anesthesia Physical Anesthesia Plan  ASA: 3  Anesthesia Plan: Spinal   Post-op Pain Management:    Induction:   PONV Risk Score and Plan: 2 and Ondansetron, Dexamethasone and Treatment may vary due to age or medical condition  Airway Management Planned: Natural Airway  Additional Equipment:   Intra-op Plan:   Post-operative Plan:   Informed Consent: I have reviewed the patients History and Physical, chart, labs and discussed the procedure including the risks, benefits and alternatives for the proposed anesthesia with the patient or authorized representative who has indicated his/her understanding and acceptance.       Plan Discussed with: CRNA  Anesthesia Plan Comments:        Anesthesia Quick Evaluation

## 2023-09-03 ENCOUNTER — Inpatient Hospital Stay (HOSPITAL_COMMUNITY)
Admission: AD | Admit: 2023-09-03 | Discharge: 2023-09-05 | DRG: 787 | Disposition: A | Discharge status: Home / Self Care | Payer: 59 | Attending: Obstetrics | Admitting: Obstetrics

## 2023-09-03 ENCOUNTER — Encounter (HOSPITAL_COMMUNITY)
Admission: AD | Disposition: A | Discharge status: Home / Self Care | Payer: Self-pay | Source: Home / Self Care | Attending: Obstetrics

## 2023-09-03 ENCOUNTER — Other Ambulatory Visit: Payer: Self-pay

## 2023-09-03 ENCOUNTER — Inpatient Hospital Stay (HOSPITAL_COMMUNITY): Admitting: Anesthesiology

## 2023-09-03 ENCOUNTER — Encounter (HOSPITAL_COMMUNITY): Payer: Self-pay | Admitting: Obstetrics

## 2023-09-03 DIAGNOSIS — O1092 Unspecified pre-existing hypertension complicating childbirth: Secondary | ICD-10-CM | POA: Diagnosis present

## 2023-09-03 DIAGNOSIS — Z6791 Unspecified blood type, Rh negative: Secondary | ICD-10-CM

## 2023-09-03 DIAGNOSIS — O26893 Other specified pregnancy related conditions, third trimester: Secondary | ICD-10-CM | POA: Diagnosis present

## 2023-09-03 DIAGNOSIS — Z87891 Personal history of nicotine dependence: Secondary | ICD-10-CM | POA: Diagnosis not present

## 2023-09-03 DIAGNOSIS — Z3A38 38 weeks gestation of pregnancy: Secondary | ICD-10-CM

## 2023-09-03 DIAGNOSIS — O34211 Maternal care for low transverse scar from previous cesarean delivery: Secondary | ICD-10-CM | POA: Diagnosis present

## 2023-09-03 DIAGNOSIS — D509 Iron deficiency anemia, unspecified: Secondary | ICD-10-CM | POA: Diagnosis present

## 2023-09-03 DIAGNOSIS — O10013 Pre-existing essential hypertension complicating pregnancy, third trimester: Secondary | ICD-10-CM | POA: Diagnosis present

## 2023-09-03 DIAGNOSIS — O9902 Anemia complicating childbirth: Secondary | ICD-10-CM | POA: Diagnosis present

## 2023-09-03 DIAGNOSIS — O34219 Maternal care for unspecified type scar from previous cesarean delivery: Secondary | ICD-10-CM

## 2023-09-03 SURGERY — Surgical Case
Anesthesia: Spinal

## 2023-09-03 MED ORDER — NALBUPHINE HCL 10 MG/ML IJ SOLN
5.0000 mg | Freq: Four times a day (QID) | INTRAMUSCULAR | Status: DC | PRN
  Administered 2023-09-03: 5 mg via INTRAVENOUS
  Filled 2023-09-03: qty 1

## 2023-09-03 MED ORDER — ACETAMINOPHEN 10 MG/ML IV SOLN
1000.0000 mg | Freq: Once | INTRAVENOUS | Status: DC | PRN
  Administered 2023-09-03: 1000 mg via INTRAVENOUS

## 2023-09-03 MED ORDER — KETOROLAC TROMETHAMINE 30 MG/ML IJ SOLN: 30.0000 mg | Freq: Once | INTRAMUSCULAR | Status: DC | PRN

## 2023-09-03 MED ORDER — WITCH HAZEL-GLYCERIN EX PADS: 1.0000 | MEDICATED_PAD | CUTANEOUS | Status: DC | PRN

## 2023-09-03 MED ORDER — FENTANYL CITRATE (PF) 100 MCG/2ML IJ SOLN: 25.0000 ug | INTRAMUSCULAR | Status: DC | PRN

## 2023-09-03 MED ORDER — SIMETHICONE 80 MG PO CHEW
80.0000 mg | CHEWABLE_TABLET | Freq: Three times a day (TID) | ORAL | Status: DC
  Administered 2023-09-03 – 2023-09-05 (×5): 80 mg via ORAL
  Filled 2023-09-03 (×5): qty 1

## 2023-09-03 MED ORDER — SODIUM CHLORIDE 0.9 % IR SOLN
Status: DC | PRN
  Administered 2023-09-03: 1

## 2023-09-03 MED ORDER — MORPHINE SULFATE (PF) 0.5 MG/ML IJ SOLN
INTRAMUSCULAR | Status: DC | PRN
  Administered 2023-09-03: 150 ug via INTRATHECAL

## 2023-09-03 MED ORDER — OXYCODONE HCL 5 MG/5ML PO SOLN: 5.0000 mg | Freq: Once | ORAL | Status: DC | PRN

## 2023-09-03 MED ORDER — CEFAZOLIN SODIUM-DEXTROSE 2-4 GM/100ML-% IV SOLN
2.0000 g | INTRAVENOUS | Status: AC
  Administered 2023-09-03: 2 g via INTRAVENOUS

## 2023-09-03 MED ORDER — ONDANSETRON HCL 4 MG/2ML IJ SOLN
INTRAMUSCULAR | Status: AC
  Filled 2023-09-03: qty 2

## 2023-09-03 MED ORDER — ACETAMINOPHEN 500 MG PO TABS
1000.0000 mg | ORAL_TABLET | Freq: Four times a day (QID) | ORAL | Status: DC
  Administered 2023-09-03 – 2023-09-05 (×8): 1000 mg via ORAL
  Filled 2023-09-03 (×9): qty 2

## 2023-09-03 MED ORDER — SOD CITRATE-CITRIC ACID 500-334 MG/5ML PO SOLN
ORAL | Status: AC
  Filled 2023-09-03: qty 30

## 2023-09-03 MED ORDER — FENTANYL CITRATE (PF) 100 MCG/2ML IJ SOLN
INTRAMUSCULAR | Status: AC
  Filled 2023-09-03: qty 2

## 2023-09-03 MED ORDER — MENTHOL 3 MG MT LOZG: 1.0000 | LOZENGE | OROMUCOSAL | Status: DC | PRN

## 2023-09-03 MED ORDER — OXYCODONE HCL 5 MG PO TABS: 5.0000 mg | ORAL_TABLET | Freq: Once | ORAL | Status: DC | PRN

## 2023-09-03 MED ORDER — OXYCODONE HCL 5 MG PO TABS: 5.0000 mg | ORAL_TABLET | ORAL | Status: DC | PRN

## 2023-09-03 MED ORDER — RHO D IMMUNE GLOBULIN 1500 UNIT/2ML IJ SOSY
300.0000 ug | PREFILLED_SYRINGE | Freq: Once | INTRAMUSCULAR | Status: AC
  Administered 2023-09-04: 300 ug via INTRAVENOUS
  Filled 2023-09-03: qty 2

## 2023-09-03 MED ORDER — DEXAMETHASONE SODIUM PHOSPHATE 10 MG/ML IJ SOLN
INTRAMUSCULAR | Status: DC | PRN
  Administered 2023-09-03: 10 mg via INTRAVENOUS

## 2023-09-03 MED ORDER — DIBUCAINE (PERIANAL) 1 % EX OINT: 1.0000 | TOPICAL_OINTMENT | CUTANEOUS | Status: DC | PRN

## 2023-09-03 MED ORDER — MORPHINE SULFATE (PF) 0.5 MG/ML IJ SOLN
INTRAMUSCULAR | Status: AC
  Filled 2023-09-03: qty 10

## 2023-09-03 MED ORDER — NIFEDIPINE ER OSMOTIC RELEASE 30 MG PO TB24: 30.0000 mg | ORAL_TABLET | Freq: Every day | ORAL | Status: DC

## 2023-09-03 MED ORDER — BUPIVACAINE IN DEXTROSE 0.75-8.25 % IT SOLN
INTRATHECAL | Status: DC | PRN
  Administered 2023-09-03: 1.6 mL via INTRATHECAL

## 2023-09-03 MED ORDER — PHENYLEPHRINE HCL-NACL 20-0.9 MG/250ML-% IV SOLN
INTRAVENOUS | Status: DC | PRN
  Administered 2023-09-03: 30 ug/min via INTRAVENOUS

## 2023-09-03 MED ORDER — PANTOPRAZOLE SODIUM 40 MG PO TBEC
40.0000 mg | DELAYED_RELEASE_TABLET | Freq: Every day | ORAL | Status: DC
  Administered 2023-09-03 – 2023-09-05 (×3): 40 mg via ORAL
  Filled 2023-09-03 (×3): qty 1

## 2023-09-03 MED ORDER — OXYTOCIN-SODIUM CHLORIDE 30-0.9 UT/500ML-% IV SOLN
INTRAVENOUS | Status: DC | PRN
  Administered 2023-09-03: 300 mL via INTRAVENOUS

## 2023-09-03 MED ORDER — OXYTOCIN-SODIUM CHLORIDE 30-0.9 UT/500ML-% IV SOLN
INTRAVENOUS | Status: AC
  Filled 2023-09-03: qty 1000

## 2023-09-03 MED ORDER — ONDANSETRON HCL 4 MG/2ML IJ SOLN
INTRAMUSCULAR | Status: DC | PRN
  Administered 2023-09-03: 4 mg via INTRAVENOUS

## 2023-09-03 MED ORDER — LACTATED RINGERS IV SOLN: INTRAVENOUS | Status: DC | PRN

## 2023-09-03 MED ORDER — DIPHENHYDRAMINE HCL 25 MG PO CAPS: 25.0000 mg | ORAL_CAPSULE | Freq: Four times a day (QID) | ORAL | Status: DC | PRN

## 2023-09-03 MED ORDER — ONDANSETRON HCL 4 MG/2ML IJ SOLN: 4.0000 mg | Freq: Three times a day (TID) | INTRAMUSCULAR | Status: DC | PRN

## 2023-09-03 MED ORDER — MEPERIDINE HCL 25 MG/ML IJ SOLN: 6.2500 mg | INTRAMUSCULAR | Status: DC | PRN

## 2023-09-03 MED ORDER — LACTATED RINGERS IV SOLN: INTRAVENOUS | Status: DC

## 2023-09-03 MED ORDER — PRENATAL MULTIVITAMIN CH
1.0000 | ORAL_TABLET | Freq: Every day | ORAL | Status: DC
  Administered 2023-09-04 – 2023-09-05 (×2): 1 via ORAL
  Filled 2023-09-03 (×2): qty 1

## 2023-09-03 MED ORDER — KETOROLAC TROMETHAMINE 30 MG/ML IJ SOLN
30.0000 mg | Freq: Four times a day (QID) | INTRAMUSCULAR | Status: AC
  Administered 2023-09-03 – 2023-09-04 (×4): 30 mg via INTRAVENOUS
  Filled 2023-09-03 (×4): qty 1

## 2023-09-03 MED ORDER — AMISULPRIDE (ANTIEMETIC) 5 MG/2ML IV SOLN: 10.0000 mg | Freq: Once | INTRAVENOUS | Status: DC | PRN

## 2023-09-03 MED ORDER — POVIDONE-IODINE 10 % EX SWAB
2.0000 | Freq: Once | CUTANEOUS | Status: AC
  Administered 2023-09-03: 2 via TOPICAL

## 2023-09-03 MED ORDER — NALOXONE HCL 4 MG/10ML IJ SOLN: 1.0000 ug/kg/h | INTRAVENOUS | Status: DC | PRN

## 2023-09-03 MED ORDER — IBUPROFEN 600 MG PO TABS
600.0000 mg | ORAL_TABLET | Freq: Four times a day (QID) | ORAL | Status: DC
Start: 2023-09-04 — End: 2023-09-05
  Administered 2023-09-04 – 2023-09-05 (×4): 600 mg via ORAL
  Filled 2023-09-03 (×5): qty 1

## 2023-09-03 MED ORDER — ACETAMINOPHEN 10 MG/ML IV SOLN
INTRAVENOUS | Status: AC
  Filled 2023-09-03: qty 100

## 2023-09-03 MED ORDER — SCOPOLAMINE 1 MG/3DAYS TD PT72: 1.0000 | MEDICATED_PATCH | Freq: Once | TRANSDERMAL | Status: DC

## 2023-09-03 MED ORDER — SENNOSIDES-DOCUSATE SODIUM 8.6-50 MG PO TABS
2.0000 | ORAL_TABLET | ORAL | Status: DC
  Administered 2023-09-04 – 2023-09-05 (×2): 2 via ORAL
  Filled 2023-09-03 (×2): qty 2

## 2023-09-03 MED ORDER — DIPHENHYDRAMINE HCL 50 MG/ML IJ SOLN
INTRAMUSCULAR | Status: AC
  Filled 2023-09-03: qty 1

## 2023-09-03 MED ORDER — DEXAMETHASONE SODIUM PHOSPHATE 10 MG/ML IJ SOLN
INTRAMUSCULAR | Status: AC
  Filled 2023-09-03: qty 1

## 2023-09-03 MED ORDER — SODIUM CHLORIDE 0.9% FLUSH: 3.0000 mL | INTRAVENOUS | Status: DC | PRN

## 2023-09-03 MED ORDER — FENTANYL CITRATE (PF) 100 MCG/2ML IJ SOLN
INTRAMUSCULAR | Status: DC | PRN
  Administered 2023-09-03: 15 ug via INTRATHECAL

## 2023-09-03 MED ORDER — NIFEDIPINE ER OSMOTIC RELEASE 30 MG PO TB24
30.0000 mg | ORAL_TABLET | Freq: Every day | ORAL | Status: DC
  Administered 2023-09-03 – 2023-09-05 (×3): 30 mg via ORAL
  Filled 2023-09-03 (×3): qty 1

## 2023-09-03 MED ORDER — OXYTOCIN-SODIUM CHLORIDE 30-0.9 UT/500ML-% IV SOLN
2.5000 [IU]/h | INTRAVENOUS | Status: AC
Start: 2023-09-03 — End: 2023-09-04
  Administered 2023-09-03: 2.5 [IU]/h via INTRAVENOUS
  Filled 2023-09-03: qty 500

## 2023-09-03 MED ORDER — COCONUT OIL OIL: 1.0000 | TOPICAL_OIL | Status: DC | PRN

## 2023-09-03 MED ORDER — SIMETHICONE 80 MG PO CHEW: 80.0000 mg | CHEWABLE_TABLET | ORAL | Status: DC | PRN

## 2023-09-03 MED ORDER — STERILE WATER FOR IRRIGATION IR SOLN
Status: DC | PRN
  Administered 2023-09-03: 1000 mL

## 2023-09-03 MED ORDER — CEFAZOLIN SODIUM-DEXTROSE 2-4 GM/100ML-% IV SOLN
INTRAVENOUS | Status: AC
  Filled 2023-09-03: qty 100

## 2023-09-03 MED ORDER — NALOXONE HCL 0.4 MG/ML IJ SOLN: 0.4000 mg | INTRAMUSCULAR | Status: DC | PRN

## 2023-09-03 MED ORDER — DIPHENHYDRAMINE HCL 50 MG/ML IJ SOLN
12.5000 mg | Freq: Four times a day (QID) | INTRAMUSCULAR | Status: DC | PRN
  Administered 2023-09-03: 12.5 mg via INTRAVENOUS

## 2023-09-03 SURGICAL SUPPLY — 34 items
BENZOIN TINCTURE PRP APPL 2/3 (GAUZE/BANDAGES/DRESSINGS) ×1 IMPLANT
CHLORAPREP W/TINT 26 (MISCELLANEOUS) ×2 IMPLANT
CLAMP UMBILICAL CORD (MISCELLANEOUS) ×1 IMPLANT
CLOTH BEACON ORANGE TIMEOUT ST (SAFETY) ×1 IMPLANT
DRSG OPSITE POSTOP 4X10 (GAUZE/BANDAGES/DRESSINGS) ×1 IMPLANT
ELECT REM PT RETURN 9FT ADLT (ELECTROSURGICAL) ×1 IMPLANT
ELECTRODE REM PT RTRN 9FT ADLT (ELECTROSURGICAL) ×1 IMPLANT
EXTRACTOR VACUUM KIWI (MISCELLANEOUS) IMPLANT
GAUZE PAD ABD 7.5X8 STRL (GAUZE/BANDAGES/DRESSINGS) IMPLANT
GAUZE SPONGE 4X4 12PLY STRL LF (GAUZE/BANDAGES/DRESSINGS) IMPLANT
GLOVE BIOGEL M STER SZ 6 (GLOVE) ×1 IMPLANT
GLOVE BIOGEL PI IND STRL 6.5 (GLOVE) ×1 IMPLANT
GLOVE BIOGEL PI IND STRL 7.0 (GLOVE) ×1 IMPLANT
GOWN STRL REUS W/TWL LRG LVL3 (GOWN DISPOSABLE) ×2 IMPLANT
KIT ABG SYR 3ML LUER SLIP (SYRINGE) IMPLANT
MAT PREVALON FULL STRYKER (MISCELLANEOUS) IMPLANT
NDL HYPO 25X5/8 SAFETYGLIDE (NEEDLE) IMPLANT
NEEDLE HYPO 25X5/8 SAFETYGLIDE (NEEDLE) IMPLANT
NS IRRIG 1000ML POUR BTL (IV SOLUTION) ×1 IMPLANT
PACK C SECTION WH (CUSTOM PROCEDURE TRAY) ×1 IMPLANT
PAD OB MATERNITY 4.3X12.25 (PERSONAL CARE ITEMS) ×1 IMPLANT
RTRCTR C-SECT PINK 25CM LRG (MISCELLANEOUS) ×1 IMPLANT
STRIP CLOSURE SKIN 1/2X4 (GAUZE/BANDAGES/DRESSINGS) ×1 IMPLANT
SUT MNCRL 0 VIOLET CTX 36 (SUTURE) ×2 IMPLANT
SUT PDS AB 0 CTX 60 (SUTURE) IMPLANT
SUT PLAIN 0 NONE (SUTURE) IMPLANT
SUT PLAIN ABS 2-0 CT1 27XMFL (SUTURE) ×1 IMPLANT
SUT VIC AB 0 CTX36XBRD ANBCTRL (SUTURE) ×2 IMPLANT
SUT VIC AB 2-0 CT1 TAPERPNT 27 (SUTURE) ×1 IMPLANT
SUT VIC AB 4-0 PS2 27 (SUTURE) ×1 IMPLANT
SUT VICRYL 4-0 PS2 18IN ABS (SUTURE) ×1 IMPLANT
TOWEL OR 17X24 6PK STRL BLUE (TOWEL DISPOSABLE) ×1 IMPLANT
TRAY FOLEY W/BAG SLVR 14FR LF (SET/KITS/TRAYS/PACK) ×1 IMPLANT
WATER STERILE IRR 1000ML POUR (IV SOLUTION) ×1 IMPLANT

## 2023-09-03 NOTE — Op Note (Signed)
 Cesarean Section Procedure Note  Pre-operative Diagnosis: 1. Intrauterine pregnancy at [redacted]w[redacted]d  2. History of cesarean section 3. Chronic hypertension  Post-operative Diagnosis: same as above  Surgeon: Marlow Baars, MD  Assistants: Ambrose Mantle, MD  Procedure: Repeat low transverse cesarean section   Anesthesia: Spinal anesthesia  Estimated Blood Loss: 793 mL         Drains: Foley catheter         Specimens: placenta to L&D              Complications:  None; patient tolerated the procedure well.         Disposition: PACU - hemodynamically stable.  Findings:  Normal uterus, tubes and ovaries bilaterally.  Viable female infant, 3280g (7lb 3.7oz) Apgars 8, 9.    Procedure Details   After spinal  anesthesia was found to adequate, the patient was placed in the dorsal supine position with a leftward tilt, prepped and draped in the usual sterile manner. A Pfannenstiel incision was made and carried down through the subcutaneous tissue to the fascia.  The fascia was incised in the midline and the fascial incision was extended laterally with Mayo scissors. The superior aspect of the fascial incision was grasped with two Kocher clamps, tented up and the rectus muscles dissected off sharply. The rectus muscles were separated in the midline with a hemostat. The abdominal peritoneum was identified, tented up, entered bluntly, and the incision was extended superiorly and inferiorly with good visualization of the bladder. The Alexis retractor was deployed. The vesicouterine peritoneum was identified, tented up, entered sharply, and the bladder flap was created digitally. A scalpel was then used to make a low transverse incision on the uterus which was extended in the cephalad-caudad direction with blunt dissection. The fluid was clear. The fetal vertex was identified, elevated out of the pelvis and brought to the hysterotomy.  The head was delivered easily followed by the shoulders and body.  After a 60  second delay per protocol, the cord was clamped and cut and the infant was passed to the waiting neonatologist.  The placenta was then delivered spontaneously, intact and appear normal, the uterus was cleared of all clot and debris   The hysterotomy was repaired with #0 Monocryl in running locked fashion.  The majority of the blood loss was from a pumping artery at the corner of the hysterotomy.  Excellent hemostasis was noted after a single layer closure.  The Alexis retractor was removed from the abdomen. The peritoneum was examined and all vessels noted to be hemostatic. The abdominal cavity was cleared of all clot and debris.  The peritoneum was closed with 2-0 vicryl in a running fashion.  The fascia and rectus muscles were inspected and were hemostatic. The fascia was closed with 0 Vicryl in a running fashion. The subcutaneous layer was irrigated and all bleeders cauterized. The subcutaneous layer was closed with interrupted plain gut. The skin was closed with 4-0 vicryl in a subcuticular fashion. The incision was dressed with benzoine, steri strips and honeycomb dressing.  A pressure dressing was placed.  All sponge lap and needle counts were correct x3. Patient tolerated the procedure well and recovered in stable condition following the procedure.

## 2023-09-03 NOTE — Anesthesia Postprocedure Evaluation (Signed)
 Anesthesia Post Note  Patient: Kristin Medina  Procedure(s) Performed: CESAREAN DELIVERY     Patient location during evaluation: PACU Anesthesia Type: Spinal Level of consciousness: awake Pain management: pain level controlled Vital Signs Assessment: post-procedure vital signs reviewed and stable Respiratory status: spontaneous breathing, nonlabored ventilation and respiratory function stable Cardiovascular status: blood pressure returned to baseline and stable Postop Assessment: no apparent nausea or vomiting Anesthetic complications: no   No notable events documented.  Last Vitals:  Vitals:   09/03/23 1330 09/03/23 1445  BP: (!) 138/92   Pulse: 70   Resp: 16 16  Temp: 36.6 C 36.8 C  SpO2: 98% 98%    Last Pain:  Vitals:   09/03/23 1445  TempSrc:   PainSc: 2    Pain Goal:                   Catheryn Bacon Zimir Kittleson

## 2023-09-03 NOTE — OR Nursing (Signed)
 Spoke with Dr Reina Fuse at [phone] about pt blood pressure she stated she is aware and to continue to monitor and will come see pt at 12 to decide about restarting medication for BP

## 2023-09-03 NOTE — Lactation Note (Signed)
 This note was copied from a baby's chart. Lactation Consultation Note  Patient Name: Kristin Medina [id number] Date: 09/03/2023 Age:35 hours, P2 experienced Breast feeder x 1 month.  Reason for consult: Initial assessment;Early term 58-38.6wks Per mom and doc flow sheets baby fed at 9 am for 30 mins.  LC reviewed the 24 breast feeding goals - feed with cues and by 3 hours offer the breast STS.  LC recommended to call for Lactation with feeding cues or at 12 N,  Mom aware if the [facility] is busy with another patient her nurse can assist.    Maternal Data Does the patient have breastfeeding experience prior to this delivery?: Yes How long did the patient breastfeed?: per mom 1 month with her 1st baby and felt she wasn't making enough milk  Feeding Mother's Current Feeding Choice: Breast Milk and Formula  LATCH Score Latch: Grasps breast easily, tongue down, lips flanged, rhythmical sucking.  Audible Swallowing: A few with stimulation  Type of Nipple: Everted at rest and after stimulation  Comfort (Breast/Nipple): Soft / non-tender  Hold (Positioning): Assistance needed to correctly position infant at breast and maintain latch.  LATCH Score: 8   Lactation Tools Discussed/Used  Not needed as of yet   Interventions  Education  LC resources and storage of breastmilk and cleaning pump parts   Discharge Pump: Personal;Hands Free (per mom hand free Mom cozy)  Consult Status Consult Status: Follow-up Date: 09/03/23 Follow-up type: In-patient    Matilde Sprang Matea Stanard 09/03/2023, 10:33 AM

## 2023-09-03 NOTE — Lactation Note (Signed)
 This note was copied from a baby's chart. Lactation Consultation Note  Patient Name: Kristin Medina Date: 09/03/2023 Age:35 hours Per MOB, she feels infant is latching well at the breast now, he recently breastfeed for 20 minutes at 1845 pm, currently asleep on MOB chest, she is doing skin to skin. MOB feels her 2nd child is latching better than her 1st. She does not have any breastfeeding questions or concerns for LC at this time. MOB will continue to breastfeed infant by cues, on demand, every 2-3 hours, skin to skin. MOB knows to call [facility] services if she needs any latch assistance.    Maternal Data    Feeding    LATCH Score                    Lactation Tools Discussed/Used    Interventions    Discharge    Consult Status      Kristin Medina 09/03/2023, 7:47 PM

## 2023-09-03 NOTE — H&P (Signed)
 33 y.o. Kristin Medina @ [redacted]w[redacted]d presents for repeat cesarean section.  Otherwise has good fetal movement and no bleeding.  Pregnancy complicated by: CHTN: on nifedipine Xl 30mg  daily History of cesarean section: arrest of descent, desires repeat History of stillbirth: at 25 weeks in 2014.  Presented to [facility] with contractions and was found to have a fetal demise. Placental pathology showed acute chorioamnionitis and funisitis. 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 Had normal [facility] labs following IUFD  Rh negative  Past Medical History:  Diagnosis Date   History of perinatal fetal loss 05/17/2013   25 wks   Hypertension    Missed ab 11/11/2020    Past Surgical History:  Procedure Laterality Date   BREAST MASS EXCISION Left 08/30/2004   @ [facility]   CESAREAN SECTION N/A 09/09/2018   Procedure: CESAREAN SECTION;  Surgeon: Marlow Baars, MD;  Location: [facility];  Service: Obstetrics;  Laterality: N/A;   DILATION AND CURETTAGE OF UTERUS N/A 02/10/2016   Procedure: SUCTION DILATATION AND CURETTAGE;  Surgeon: Hermina Staggers, MD;  Location: [facility];  Service: Gynecology;  Laterality: N/A;   DILATION AND EVACUATION N/A 11/14/2020   Procedure: DILATATION AND EVACUATION;  Surgeon: Marlow Baars, MD;  Location: [facility];  Service: Gynecology;  Laterality: N/A;  Request chromosome studies    OB History  Gravida Para Term Preterm AB Living  8 2 1 1 5 1   SAB IAB Ectopic Multiple Live Births  3 2 0 0 1    # Outcome Date GA Lbr Len/2nd Weight Sex Type Anes PTL Lv  8 Current           7 SAB 11/11/20 [redacted]w[redacted]d         6 Term 09/09/18 [redacted]w[redacted]d 07:13 / 03:29 3580 g M CS-LTranv EPI  LIV  5 SAB 02/25/17 [redacted]w[redacted]d         4 SAB 03/2014 [redacted]w[redacted]d         3 Preterm 05/17/13 [redacted]w[redacted]d 21:25 / 00:16 921 g M Vag-Spont EPI  FD  2 IAB 2009          1 IAB 2008            Social History   Socioeconomic History   Marital status: Single    Spouse name: Not on file   Number of  children: Not on file   Years of education: Not on file   Highest education level: Not on file  Occupational History   Not on file  Tobacco Use   Smoking status: Former    Types: Cigars   Smokeless tobacco: Never  Vaping Use   Vaping status: Never Used  Substance and Sexual Activity   Alcohol use: No   Drug use: Not Currently    Types: Marijuana   Sexual activity: Yes    Birth control/protection: None  Other Topics Concern   Not on file  Social History Narrative   Not on file   Social Drivers of Health   Financial Resource Strain: Not on file  Food Insecurity: No Food Insecurity (09/03/2023)   Hunger Vital Sign    Worried About Running Out of Food in the Last Year: Never true    Ran Out of Food in the Last Year: Never true  Transportation Needs: No Transportation Needs (09/03/2023)   PRAPARE - Administrator, Civil Service (Medical): No    Lack of Transportation (Non-Medical): No  Physical Activity: Not on file  Stress:  Not on file  Social Connections: Not on file  Intimate Partner Violence: Not At Risk (09/03/2023)   Humiliation, Afraid, Rape, and Kick questionnaire    Fear of Current or Ex-Partner: No    Emotionally Abused: No    Physically Abused: No    Sexually Abused: No   Patient has no known allergies.    Prenatal Transfer Tool  Maternal Diabetes: No Genetic Screening: Normal Maternal Ultrasounds/Referrals: Normal Fetal Ultrasounds or other Referrals:  None Maternal Substance Abuse:  No Significant Maternal Medications:  Meds include: Other: procardia Xl Significant Maternal Lab Results: Group B Strep negative  ABO, Rh: --/--/A NEG (04/07 0914) Antibody: NEG (04/07 0914) Rubella: Immune (10/07 0000) RPR: NON REACTIVE (04/07 0930)  HBsAg: Negative (10/07 0000)  HIV: Non-reactive (10/07 0000)  GBS:   Negative     Vitals:   09/03/23 0613  BP: (!) 150/95  Pulse: 81  Resp: 16  Temp: 98.1 F (36.7 C)  SpO2: 93%     General:   NAD Abdomen:  soft, gravid Ex:  trace edema FHTs:  138    A/P   33 y.o. Kristin Medina [redacted]w[redacted]d presents for repeat cesarean section in the setting of CHTN    Repeat cesarean section. Discussed risks of cesarean section to include, but not limited to, infection, bleeding, damage to surrounding strutcures (including bowel, bladder, tubes, ovaries, nerves, vessels, baby), need for additional procedures, risk of blood clot, need for transfusion. Consent signed.  Ancef 2gm on call to OR  CHTN:  Continue procardia Xl 30mg  daily.  She did NOT take her medication this AM.  Will administer in PACU Rh negative: rhogam studies postpartum  Gabor Lusk GEFFEL Mariska Daffin

## 2023-09-03 NOTE — Anesthesia Procedure Notes (Signed)
 Spinal  Patient location during procedure: OR Start time: 09/03/2023 7:31 AM End time: 09/03/2023 7:36 AM Reason for block: surgical anesthesia Staffing Performed: anesthesiologist  Anesthesiologist: Leonides Grills, MD Performed by: Leonides Grills, MD Authorized by: Leonides Grills, MD   Preanesthetic Checklist Completed: patient identified, IV checked, risks and benefits discussed, surgical consent, monitors and equipment checked, pre-op evaluation and timeout performed Spinal Block Patient position: sitting Prep: DuraPrep Patient monitoring: cardiac monitor, continuous pulse ox and blood pressure Approach: midline Location: L4-5 Injection technique: single-shot Needle Needle type: Pencan  Needle gauge: 24 G Needle length: 9 cm Assessment Sensory level: T10 Events: CSF return Additional Notes Functioning IV was confirmed and monitors were applied. Sterile prep and drape, including hand hygiene and sterile gloves were used. The patient was positioned and the spine was prepped. The skin was anesthetized with lidocaine.  Free flow of clear CSF was obtained prior to injecting local anesthetic into the CSF.  The spinal needle aspirated freely following injection.  The needle was carefully withdrawn.  The patient tolerated the procedure well.

## 2023-09-03 NOTE — Progress Notes (Addendum)
 Called by RN regarding BP - most recently 148/90, 160/95, 154/94. Asymptomatic from preE standpoint. Will give one dose fo 30XL at this time, normally take sin AM, already ordered BP (!) 154/94 Comment: Dr Reina Fuse is notified  Pulse (!) 59   Temp 97.7 F (36.5 C) (Oral)   Resp 16   Ht 5\' 3"  (1.6 m)   Wt 100.7 kg   LMP 08/23/2022 (Approximate)   SpO2 99%   Breastfeeding Unknown   BMI 39.33 kg/m   ADDENDUM 1933 - pt remains normotensive on routine dosing. Continue to monitor BP 132/79 (BP Location: Right Arm)   Pulse 79   Temp 98.3 F (36.8 C) (Oral)   Resp 16   Ht 5\' 3"  (1.6 m)   Wt 100.7 kg   LMP 08/23/2022 (Approximate)   SpO2 98%   Breastfeeding Unknown   BMI 39.33 kg/m

## 2023-09-03 NOTE — Transfer of Care (Signed)
 Immediate Anesthesia Transfer of Care Note  Patient: Kristin Medina  Procedure(s) Performed: CESAREAN DELIVERY  Patient Location: PACU  Anesthesia Type:Spinal  Level of Consciousness: awake, alert , and oriented  Airway & Oxygen Therapy: Patient Spontanous Breathing  Post-op Assessment: Report given to RN and Post -op Vital signs reviewed and stable  Post vital signs: Reviewed and stable  Last Vitals:  Vitals Value Taken Time  BP 132/83 09/03/23 0851  Temp    Pulse 53 09/03/23 0855  Resp 13 09/03/23 0855  SpO2 97 % 09/03/23 0855  Vitals shown include unfiled device data.  Last Pain:  Vitals:   09/03/23 0613  TempSrc: Oral  PainSc: 0-No pain         Complications: No notable events documented.

## 2023-09-04 LAB — CBC
HCT: 26.1 % — ABNORMAL LOW (ref 36.0–46.0)
Hemoglobin: 8.9 g/dL — ABNORMAL LOW (ref 12.0–15.0)
MCH: 27.6 pg (ref 26.0–34.0)
MCHC: 34.1 g/dL (ref 30.0–36.0)
MCV: 80.8 fL (ref 80.0–100.0)
Platelets: 286 10*3/uL (ref 150–400)
RBC: 3.23 MIL/uL — ABNORMAL LOW (ref 3.87–5.11)
RDW: 15.7 % — ABNORMAL HIGH (ref 11.5–15.5)
WBC: 12.2 10*3/uL — ABNORMAL HIGH (ref 4.0–10.5)
nRBC: 0 % (ref 0.0–0.2)

## 2023-09-04 NOTE — Progress Notes (Signed)
 Subjective: Postpartum Day 1: Cesarean Delivery Patient reports pain controlled, no nausea or vomiting, tolerating po. Ambulating and voiding without difficulty  Objective: Vital signs in last 24 hours: Temp:  [97.9 F (36.6 C)-98.3 F (36.8 C)] 98.2 F (36.8 C) (04/09 0322) Pulse Rate:  [69-79] 69 (04/09 0322) Resp:  [16] 16 (04/09 0322) BP: (116-138)/(71-92) 116/71 (04/09 0322) SpO2:  [98 %] 98 % (04/09 0322)  Physical Exam:  General: alert, cooperative, and appears stated age Lochia: appropriate Uterine Fundus: firm Incision: healing well DVT Evaluation: No evidence of DVT seen on physical exam.  Recent Labs    09/02/23 0930 09/04/23 0456  HGB 10.0* 8.9*  HCT 31.4* 26.1*    Assessment/Plan: Status post Cesarean section. Doing well postoperatively.  Continue current care. Desires neonatal circumcision, R/B/A of procedure discussed at length. Pt understands that neonatal circumcision is not considered medically necessary and is elective. The risks include, but are not limited to bleeding, infection, damage to the penis, development of scar tissue, and having to have it redone at a later date. Pt understands theses risks and wishes to proceed  Waynard Reeds, MD 09/04/2023, 11:28 AM

## 2023-09-04 NOTE — Lactation Note (Signed)
 This note was copied from a baby's chart. Lactation Consultation Note  Patient Name: Boy Kateria Cutrona [id number] Date: 09/04/2023 Age:35 hours Reason for consult: Follow-up assessment;Early term 37-38.6wks  P2- MOB reports that feedings have been going well so far. MOB informed LC that there was a long window today of no feedings because infant was sleepy from his circumcision and by the time they all woke up from their nap, it had been over 3 hrs. MOB has been pumping here and there with her MomCozy pump to ensure there is milk in her breasts. During this consult, MOB had just finished pumping 30 mL of EBM and was bottle feeding it to infant. Infant tolerated the bottle feeding well and even latched onto the breast for 5 minutes after. LC praised MOB and infant. LC reviewed the expected day 2 cluster feeding for tonight. LC encouraged MOB to call for further assistance as needed.  Maternal Data Has patient been taught Hand Expression?: Yes Does the patient have breastfeeding experience prior to this delivery?: Yes How long did the patient breastfeed?: 1 month  Feeding Mother's Current Feeding Choice: Breast Milk Nipple Type: Regular  LATCH Score Latch: Grasps breast easily, tongue down, lips flanged, rhythmical sucking.  Audible Swallowing: Spontaneous and intermittent  Type of Nipple: Everted at rest and after stimulation  Comfort (Breast/Nipple): Soft / non-tender  Hold (Positioning): No assistance needed to correctly position infant at breast.  LATCH Score: 10   Lactation Tools Discussed/Used Tools: Coconut oil Pump Education: Milk Storage  Interventions Interventions: Breast feeding basics reviewed;Coconut oil;Education;LC Services brochure;Pace feeding  Discharge Discharge Education: Engorgement and breast care;Warning signs for feeding baby Pump: Hands Free;Personal  Consult Status Consult Status: Follow-up Date: 09/05/23 Follow-up type: In-patient    Dema Severin  BS, IBCLC 09/04/2023, 8:39 PM

## 2023-09-04 NOTE — Progress Notes (Signed)
CSW received consult for hx of marijuana use.  Referral was screened out due to the following:  ~MOB had no documented substance use after initial prenatal visit/+UPT.  ~MOB had no positive drug screens after initial prenatal visit/+UPT.  ~Baby's UDS is negative.  Please consult CSW if current concerns arise or by MOB's request.  CSW will monitor CDS results and make report to Child Protective Services if warranted.  Enos Fling, Theresia Majors Clinical Social Worker [phone]

## 2023-09-05 LAB — RH IG WORKUP (INCLUDES ABO/RH)
Fetal Screen: NEGATIVE
Gestational Age(Wks): 38
Unit division: 0

## 2023-09-05 MED ORDER — IBUPROFEN 600 MG PO TABS: 600.0000 mg | ORAL_TABLET | Freq: Four times a day (QID) | ORAL | 0 refills | Status: DC

## 2023-09-05 MED ORDER — OXYCODONE HCL 5 MG PO TABS: 5.0000 mg | ORAL_TABLET | Freq: Four times a day (QID) | ORAL | 0 refills | Status: DC | PRN

## 2023-09-05 NOTE — Discharge Summary (Signed)
 Postpartum Discharge Summary  Date of Service updated 09/05/23     Patient Name: Kristin Medina DOB: 05/28/1991 MRN: [id number]  Date of admission: 09/03/2023 Delivery date:09/03/2023 Delivering provider: Marlow Baars Date of discharge: 09/05/2023  Admitting diagnosis: Benign essential hypertension antepartum in third trimester [O10.013] Delivery of pregnancy by cesarean section [O82] Intrauterine pregnancy: [redacted]w[redacted]d     Secondary diagnosis:  Principal Problem:   Benign essential hypertension antepartum in third trimester Active Problems:   Delivery of pregnancy by cesarean section  Additional problems: Chronic hypertension    Discharge diagnosis: Term Pregnancy Delivered and CHTN                                              Post partum procedures:rhogam Augmentation: N/A Complications: None  Hospital course: Sceduled C/S   [name] at 103w1d was admitted to the hospital 09/03/2023 for scheduled cesarean section with the following indication:Elective Repeat.Delivery details are as follows:  Membrane Rupture Time/Date: 7:56 AM,09/03/2023  Delivery Method:C-Section, Low Transverse Operative Delivery:N/A Details of operation can be found in separate operative note.  Patient had a postpartum course complicated by n/a.  She is ambulating, tolerating a regular diet, passing flatus, and urinating well. Patient is discharged home in stable condition on  09/05/23        Newborn Data: Birth date:09/03/2023 Birth time:7:56 AM Gender:Female Living status:Living Apgars:8 ,9  Weight:3280 g    Magnesium Sulfate received: No BMZ received: No Rhophylac:Yes MMR:N/A T-DaP:Given prenatally Flu: Yes RSV Vaccine received: No Transfusion:No Immunizations administered: Immunization History  Administered Date(s) Administered   Rho (D) Immune Globulin 05/19/2013    Physical exam  Vitals:   09/03/23 2330 09/04/23 0322 09/04/23 2213 09/05/23 0553  BP: 129/83 116/71 (!) 141/80 131/85   Pulse: 73 69 74 81  Resp: 16 16 16 16   Temp: 98.1 F (36.7 C) 98.2 F (36.8 C) 98.2 F (36.8 C) 97.8 F (36.6 C)  TempSrc: Oral Oral Oral Oral  SpO2: 98% 98% 99% 97%  Weight:      Height:       General: alert, cooperative, and no distress Lochia: appropriate Uterine Fundus: firm Incision: Dressing is clean, dry, and intact DVT Evaluation: No evidence of DVT seen on physical exam. Labs: Lab Results  Component Value Date   WBC 12.2 (H) 09/04/2023   HGB 8.9 (L) 09/04/2023   HCT 26.1 (L) 09/04/2023   MCV 80.8 09/04/2023   PLT 286 09/04/2023      Latest Ref Rng & Units 02/09/2023    1:39 PM  CMP  Glucose 70 - 99 mg/dL 97   BUN 6 - 20 mg/dL 6   Creatinine 2.44 - 0.10 mg/dL 2.72   Sodium 536 - 644 mmol/L 139   Potassium 3.5 - 5.1 mmol/L 3.4   Chloride 98 - 111 mmol/L 100   CO2 22 - 32 mmol/L 25   Calcium 8.9 - 10.3 mg/dL 9.2   Total Protein 6.5 - 8.1 g/dL 7.4   Total Bilirubin 0.3 - 1.2 mg/dL 0.4   Alkaline Phos 38 - 126 U/L 51   AST 15 - 41 U/L 13   ALT 0 - 44 U/L 9    Edinburgh Score:    09/04/2023    7:10 PM  Edinburgh Postnatal Depression Scale Screening Tool  I have been able to laugh and see the  funny side of things. 0  I have looked forward with enjoyment to things. 0  I have blamed myself unnecessarily when things went wrong. 0  I have been anxious or worried for no good reason. 0  I have felt scared or panicky for no good reason. 0  Things have been getting on top of me. 0  I have been so unhappy that I have had difficulty sleeping. 0  I have felt sad or miserable. 0  I have been so unhappy that I have been crying. 0  The thought of harming myself has occurred to me. 0  Edinburgh Postnatal Depression Scale Total 0      After visit meds:  Allergies as of 09/05/2023   No Known Allergies      Medication List     STOP taking these medications    aspirin EC 81 MG tablet       TAKE these medications    albuterol 108 (90 Base) MCG/ACT  inhaler Commonly known as: VENTOLIN HFA Inhale 1-2 puffs into the lungs every 4 (four) hours as needed for wheezing or shortness of breath.   FeroSul 325 (65 Fe) MG tablet Generic drug: ferrous sulfate Take 325 mg by mouth daily.   ibuprofen 600 MG tablet Commonly known as: ADVIL Take 1 tablet (600 mg total) by mouth every 6 (six) hours.   NIFEdipine 30 MG 24 hr tablet Commonly known as: ADALAT CC Take 30 mg by mouth daily.   omeprazole 20 MG capsule Commonly known as: PRILOSEC Take 20 mg by mouth daily.   oxyCODONE 5 MG immediate release tablet Commonly known as: Oxy IR/ROXICODONE Take 1 tablet (5 mg total) by mouth every 6 (six) hours as needed for severe pain (pain score 7-10).         Discharge home in stable condition Infant Feeding: Breast Infant Disposition:home with mother Discharge instruction: per After Visit Summary and Postpartum booklet. Activity: Advance as tolerated. Pelvic rest for 6 weeks.  Diet: routine diet Anticipated Birth Control: Unsure Postpartum Appointment:4 weeks Additional Postpartum F/U:  n/a Future Appointments:No future appointments. Follow up Visit:  Follow-up Information     Marlow Baars, MD Follow up in 4 week(s).   Specialty: Obstetrics Contact information: [address] [place] [address] [phone]                     09/05/2023 [facility] Lizabeth Leyden, MD

## 2023-09-05 NOTE — Progress Notes (Signed)
 Patient is doing well.  She is tolerating PO, ambulating, voiding.  Pain is controlled.  Lochia is appropriate  Vitals:   09/03/23 2330 09/04/23 0322 09/04/23 2213 09/05/23 0553  BP: 129/83 116/71 (!) 141/80 131/85  Pulse: 73 69 74 81  Resp: 16 16 16 16   Temp: 98.1 F (36.7 C) 98.2 F (36.8 C) 98.2 F (36.8 C) 97.8 F (36.6 C)  TempSrc: Oral Oral Oral Oral  SpO2: 98% 98% 99% 97%  Weight:      Height:        NAD Abdomen:  soft, appropriate tenderness, incisions intact and without erythema or drainage ext:    Symmetric, trace edema bilaterally  Lab Results  Component Value Date   WBC 12.2 (H) 09/04/2023   HGB 8.9 (L) 09/04/2023   HCT 26.1 (L) 09/04/2023   MCV 80.8 09/04/2023   PLT 286 09/04/2023    --/--/A NEG (04/07 0914)  A/P    33 y.o. [id number] POD #2 s/p RCS at 38 weeks for [facility] Routine post op and postpartum care.   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  BP at baseline Rh negative: s/p rhogam Iron deficiency anemia of pregnancy, not clinically significant to admission.  Cont iron every other day at discharge

## 2023-09-13 ENCOUNTER — Telehealth (HOSPITAL_COMMUNITY): Payer: Self-pay | Admitting: *Deleted

## 2023-09-13 NOTE — Telephone Encounter (Signed)
 09/13/2023  Name: Kristin Medina MRN: [id number] DOB: May 18, 1991  Reason for Call:  Transition of Care Hospital Discharge Call  Contact Status: Patient Contact Status: Complete  Language assistant needed: Interpreter Mode: Interpreter Not Needed        Follow-Up Questions: Do You Have Any Concerns About Your Health As You Heal From Delivery?: No Do You Have Any Concerns About Your Infants Health?: No  Edinburgh Postnatal Depression Scale:  In the Past 7 Days:    PHQ2-9 Depression Scale:     Discharge Follow-up: Edinburgh score requires follow up?:  (declines screening today, endorses she is doing well emotionally and that answers would be the same today as in the hospital where score was "0") Patient was advised of the following resources:: Support Group, Breastfeeding Support Group (declines postpartum group information via email)  Post-discharge interventions: Reviewed Newborn Safe Sleep Practices  Pearlie Bougie, RN 09/13/2023 13:49

## 2024-04-01 ENCOUNTER — Ambulatory Visit (INDEPENDENT_AMBULATORY_CARE_PROVIDER_SITE_OTHER)

## 2024-04-01 ENCOUNTER — Other Ambulatory Visit: Payer: Self-pay

## 2024-04-01 ENCOUNTER — Encounter (HOSPITAL_COMMUNITY): Payer: Self-pay | Admitting: *Deleted

## 2024-04-01 ENCOUNTER — Ambulatory Visit (HOSPITAL_COMMUNITY)
Admission: EM | Admit: 2024-04-01 | Discharge: 2024-04-01 | Disposition: A | Discharge status: Home / Self Care | Attending: Physician Assistant | Admitting: Physician Assistant

## 2024-04-01 DIAGNOSIS — J209 Acute bronchitis, unspecified: Secondary | ICD-10-CM

## 2024-04-01 DIAGNOSIS — R0789 Other chest pain: Secondary | ICD-10-CM | POA: Diagnosis not present

## 2024-04-01 DIAGNOSIS — R051 Acute cough: Secondary | ICD-10-CM

## 2024-04-01 LAB — POC COVID19/FLU A&B COMBO
Covid Antigen, POC: NEGATIVE
Influenza A Antigen, POC: NEGATIVE
Influenza B Antigen, POC: NEGATIVE

## 2024-04-01 MED ORDER — ALBUTEROL SULFATE HFA 108 (90 BASE) MCG/ACT IN AERS: 1.0000 | INHALATION_SPRAY | Freq: Four times a day (QID) | RESPIRATORY_TRACT | 0 refills | Status: DC | PRN

## 2024-04-01 MED ORDER — BUDESONIDE-FORMOTEROL FUMARATE 80-4.5 MCG/ACT IN AERO: 2.0000 | INHALATION_SPRAY | Freq: Two times a day (BID) | RESPIRATORY_TRACT | 0 refills | Status: AC

## 2024-04-01 MED ORDER — ALBUTEROL SULFATE (2.5 MG/3ML) 0.083% IN NEBU
INHALATION_SOLUTION | RESPIRATORY_TRACT | Status: AC
  Filled 2024-04-01: qty 3

## 2024-04-01 MED ORDER — ALBUTEROL SULFATE HFA 108 (90 BASE) MCG/ACT IN AERS: 1.0000 | INHALATION_SPRAY | Freq: Four times a day (QID) | RESPIRATORY_TRACT | 0 refills | Status: AC | PRN

## 2024-04-01 MED ORDER — BUDESONIDE-FORMOTEROL FUMARATE 80-4.5 MCG/ACT IN AERO: 2.0000 | INHALATION_SPRAY | Freq: Two times a day (BID) | RESPIRATORY_TRACT | 0 refills | Status: DC

## 2024-04-01 MED ORDER — ALBUTEROL SULFATE (2.5 MG/3ML) 0.083% IN NEBU
2.5000 mg | INHALATION_SOLUTION | Freq: Once | RESPIRATORY_TRACT | Status: AC
  Administered 2024-04-01: 2.5 mg via RESPIRATORY_TRACT

## 2024-04-01 NOTE — ED Triage Notes (Signed)
 PT reports cp,cough and SHOB started 2days ago. Pt wants a refill on inhaler

## 2024-04-01 NOTE — ED Provider Notes (Signed)
 [facility]    CSN: [id number] Arrival date & time: 04/01/24  1344      History   Chief Complaint Chief Complaint  Patient presents with   Cough   Chest Pain   Medication Refill    HPI Kristin Medina is a 33 y.o. female.   Patient presents today with a 2-day history of URI symptoms including congestion, cough, shortness of breath, chest tightness.  She denies any fever, nausea, vomiting, diarrhea.  She does have a 78-month-old at home and reports that he has had a cough but he does not attend daycare.  She denies any additional sick contacts.  She has tried Sudafed with temporary improvement of symptoms.  Denies any recent antibiotics or steroids.  She has required an albuterol  inhaler in the past but denies formal diagnosis of asthma or COPD.  She does smoke.  She has not had COVID recently.  She does report a tightness in her chest particularly with coughing but denies any significant chest pain.  She is confident that she is not pregnant.  She is actively breast-feeding.    Past Medical History:  Diagnosis Date   History of perinatal fetal loss 05/17/2013   25 wks   Hypertension    Missed ab 11/11/2020    Patient Active Problem List   Diagnosis Date Noted   Benign essential hypertension antepartum in third trimester 09/03/2023   Delivery of pregnancy by cesarean section 09/03/2023   Iron  deficiency anemia 03/16/2022   History of perinatal fetal loss 09/08/2018   Termination of pregnancy (fetus)    Pregnancy 05/17/2013    Past Surgical History:  Procedure Laterality Date   BREAST MASS EXCISION Left 08/30/2004   @ [facility]   CESAREAN SECTION N/A 09/09/2018   Procedure: CESAREAN SECTION;  Surgeon: Gretta Gums, MD;  Location: [facility];  Service: Obstetrics;  Laterality: N/A;   CESAREAN SECTION N/A 09/03/2023   Procedure: CESAREAN DELIVERY;  Surgeon: Gretta Gums, MD;  Location: [facility];  Service: Obstetrics;  Laterality: N/A;   DILATION AND CURETTAGE OF  UTERUS N/A 02/10/2016   Procedure: SUCTION DILATATION AND CURETTAGE;  Surgeon: Ozell LITTIE Cowman, MD;  Location: [facility];  Service: Gynecology;  Laterality: N/A;   DILATION AND EVACUATION N/A 11/14/2020   Procedure: DILATATION AND EVACUATION;  Surgeon: Gretta Gums, MD;  Location: [facility];  Service: Gynecology;  Laterality: N/A;  Request chromosome studies    OB History     Gravida  8   Para  3   Term  2   Preterm  1   AB  5   Living  2      SAB  3   IAB  2   Ectopic  0   Multiple  0   Live Births  2            Home Medications    Prior to Admission medications   Medication Sig Start Date End Date Taking? Authorizing Provider  NIFEdipine  (ADALAT  CC) 30 MG 24 hr tablet Take 30 mg by mouth daily.   Yes [provider]  albuterol  (VENTOLIN  HFA) 108 (90 Base) MCG/ACT inhaler Inhale 1-2 puffs into the lungs every 6 (six) hours as needed for wheezing or shortness of breath. 04/01/24   Decklyn Hornik K, PA-C  budesonide-formoterol (SYMBICORT) 80-4.5 MCG/ACT inhaler Inhale 2 puffs into the lungs in the morning and at bedtime. 04/01/24   Fahad Cisse, Rocky POUR, PA-C    Family History Family  History  Problem Relation Age of Onset   Diabetes Father    Heart attack Paternal Uncle    Heart attack Paternal Grandfather     Social History Social History   Tobacco Use   Smoking status: Former    Types: Cigars   Smokeless tobacco: Never  Vaping Use   Vaping status: Never Used  Substance Use Topics   Alcohol use: No   Drug use: Not Currently    Types: Marijuana     Allergies   Patient has no known allergies.   Review of Systems Review of Systems  Constitutional:  Positive for activity change. Negative for appetite change, fatigue and fever.  HENT:  Positive for congestion and sinus pressure. Negative for sneezing and sore throat.   Respiratory:  Positive for cough, chest tightness and shortness of breath. Negative for wheezing.    Cardiovascular:  Negative for chest pain.  Gastrointestinal:  Negative for abdominal pain, diarrhea, nausea and vomiting.     Physical Exam Triage Vital Signs ED Triage Vitals  Encounter Vitals Group     BP 04/01/24 1513 (!) 127/91     Girls Systolic BP Percentile --      Girls Diastolic BP Percentile --      Boys Systolic BP Percentile --      Boys Diastolic BP Percentile --      Pulse Rate 04/01/24 1513 96     Resp 04/01/24 1513 18     Temp 04/01/24 1513 99.5 F (37.5 C)     Temp src --      SpO2 04/01/24 1513 94 %     Weight --      Height --      Head Circumference --      Peak Flow --      Pain Score 04/01/24 1509 3     Pain Loc --      Pain Education --      Exclude from Growth Chart --    No data found.  Updated Vital Signs BP (!) 127/91   Pulse 96   Temp 99.5 F (37.5 C)   Resp 18   LMP 02/26/2024 (Approximate) Comment: PT still breast feeding and periods are not regular  SpO2 94%   Breastfeeding Yes   Visual Acuity Right Eye Distance:   Left Eye Distance:   Bilateral Distance:    Right Eye Near:   Left Eye Near:    Bilateral Near:     Physical Exam Vitals reviewed.  Constitutional:      General: She is awake. She is not in acute distress.    Appearance: Normal appearance. She is well-developed. She is not ill-appearing.     Comments: Very pleasant female appears stated age in no acute distress sitting comfortably in exam room  HENT:     Head: Normocephalic and atraumatic.     Right Ear: Tympanic membrane, ear canal and external ear normal. Tympanic membrane is not erythematous or bulging.     Left Ear: Tympanic membrane, ear canal and external ear normal. Tympanic membrane is not erythematous or bulging.     Nose:     Right Sinus: Maxillary sinus tenderness present. No frontal sinus tenderness.     Left Sinus: Maxillary sinus tenderness present. No frontal sinus tenderness.     Mouth/Throat:     Pharynx: Uvula midline. No oropharyngeal  exudate or posterior oropharyngeal erythema.  Cardiovascular:     Rate and Rhythm: Normal rate and regular rhythm.  Heart sounds: Normal heart sounds, S1 normal and S2 normal. No murmur heard. Pulmonary:     Effort: Pulmonary effort is normal.     Breath sounds: Wheezing present. No rhonchi or rales.     Comments: Widespread wheezing Psychiatric:        Behavior: Behavior is cooperative.      UC Treatments / Results  Labs (all labs ordered are listed, but only abnormal results are displayed) Labs Reviewed  POC COVID19/FLU A&B COMBO    EKG   Radiology DG Chest 2 View Result Date: 04/01/2024 CLINICAL DATA:  Wheezing and cough. EXAM: CHEST - 2 VIEW COMPARISON:  Chest radiograph dated 02/24/2021. FINDINGS: Mild chronic antral coarsening and bronchitic changes. No focal consolidation, pleural effusion or pneumothorax. Stable cardiac silhouette. No acute osseous pathology. IMPRESSION: No active cardiopulmonary disease. Electronically Signed   By: Vanetta Chou M.D.   On: 04/01/2024 16:53    Procedures Procedures (including critical care time)  Medications Ordered in UC Medications  albuterol  (PROVENTIL ) (2.5 MG/3ML) 0.083% nebulizer solution 2.5 mg (2.5 mg Nebulization Given 04/01/24 1602)    Initial Impression / Assessment and Plan / UC Course  I have reviewed the triage vital signs and the nursing notes.  Pertinent labs & imaging results that were available during my care of the patient were reviewed by me and considered in my medical decision making (see chart for details).     Patient is well-appearing, afebrile, nontoxic, nontachycardic.  No evidence of acute infection on physical exam that warrant initiation of antibiotics.  COVID and flu testing were negative.  Patient did have wheezing on exam and was given albuterol  nebulizer with improvement of symptoms.  EKG was obtained in triage that showed normal sinus rhythm with ventricular rate of 99 bpm without ischemic  changes but with nonspecific ST changes in lead III and late R wave progression; no previous to compare.  Chest x-ray was obtained that showed no acute cardiopulmonary disease.  Low suspicion for PE given Wells score of 0.  Will treat for acute bronchitis with albuterol .  She is currently breast-feeding so we will defer systemic steroids due to concern that this would pass to her breastmilk and instead use Symbicort as this is thought to have a lower systemic absorption.  We did discuss that she should rinse her mouth following use of this medication as well as avoid breast-feeding for a few hours after the dose.  We discussed that if her symptoms are not improving within a few days or if she has any worsening symptoms including chest pain, shortness of breath, weakness, palpitations, lightheadedness she needs to be seen immediately.  Strict return precautions given.  Excuse note provided.  All questions answered to patient satisfaction.  Final Clinical Impressions(s) / UC Diagnoses   Final diagnoses:  Acute cough  Acute bronchitis, unspecified organism  Chest discomfort     Discharge Instructions      Your COVID and flu testing was negative.  Your chest x-ray was normal.  I believe they have a different virus that is causing inflammation in your lungs.  Use albuterol  every 4-6 hours as needed.  Start Symbicort twice a day.  Rinse your mouth following use of this medication to prevent thrush.  It is not thought to pass through your breastmilk and large quantities but I do recommend waiting 4 hours after using the medication before breast-feeding.  If you are not feeling better within a week please return for reevaluation.  If anything worsens and  you have chest pain, shortness of breath, weakness, high fever you need to be seen immediately.     ED Prescriptions     Medication Sig Dispense Auth. Provider   albuterol  (VENTOLIN  HFA) 108 (90 Base) MCG/ACT inhaler  (Status: Discontinued) Inhale 1-2  puffs into the lungs every 6 (six) hours as needed for wheezing or shortness of breath. 18 g Tashica Provencio K, PA-C   budesonide-formoterol (SYMBICORT) 80-4.5 MCG/ACT inhaler  (Status: Discontinued) Inhale 2 puffs into the lungs in the morning and at bedtime. 1 each Dwyne Hasegawa K, PA-C   albuterol  (VENTOLIN  HFA) 108 (90 Base) MCG/ACT inhaler Inhale 1-2 puffs into the lungs every 6 (six) hours as needed for wheezing or shortness of breath. 18 g Torres Hardenbrook K, PA-C   budesonide-formoterol (SYMBICORT) 80-4.5 MCG/ACT inhaler Inhale 2 puffs into the lungs in the morning and at bedtime. 1 each Maranda Marte, Rocky POUR, PA-C      PDMP not reviewed this encounter.   Sherrell Rocky POUR, PA-C 04/01/24 1713

## 2024-04-01 NOTE — Discharge Instructions (Signed)
 Your COVID and flu testing was negative.  Your chest x-ray was normal.  I believe they have a different virus that is causing inflammation in your lungs.  Use albuterol  every 4-6 hours as needed.  Start Symbicort twice a day.  Rinse your mouth following use of this medication to prevent thrush.  It is not thought to pass through your breastmilk and large quantities but I do recommend waiting 4 hours after using the medication before breast-feeding.  If you are not feeling better within a week please return for reevaluation.  If anything worsens and you have chest pain, shortness of breath, weakness, high fever you need to be seen immediately.
